# Patient Record
Sex: Male | Born: 1965 | Hispanic: Yes | Marital: Single
Health system: Southern US, Community
[De-identification: ages and names within clinical notes are randomized; demographics above are authoritative.]

## PROBLEM LIST (undated history)

## (undated) DIAGNOSIS — J9621 Acute and chronic respiratory failure with hypoxia: Secondary | ICD-10-CM

## (undated) DIAGNOSIS — J9811 Atelectasis: Secondary | ICD-10-CM

## (undated) DIAGNOSIS — S0993XS Unspecified injury of face, sequela: Secondary | ICD-10-CM

## (undated) DIAGNOSIS — Z930 Tracheostomy status: Secondary | ICD-10-CM

---

## 2019-12-16 ENCOUNTER — Other Ambulatory Visit (HOSPITAL_COMMUNITY): Payer: Self-pay

## 2019-12-16 ENCOUNTER — Inpatient Hospital Stay
Admission: RE | Admit: 2019-12-16 | Discharge: 2020-01-12 | Payer: PRIVATE HEALTH INSURANCE | Source: Other Acute Inpatient Hospital | Attending: Internal Medicine | Admitting: Internal Medicine

## 2019-12-16 DIAGNOSIS — J9690 Respiratory failure, unspecified, unspecified whether with hypoxia or hypercapnia: Secondary | ICD-10-CM

## 2019-12-16 DIAGNOSIS — J9811 Atelectasis: Secondary | ICD-10-CM | POA: Diagnosis present

## 2019-12-16 DIAGNOSIS — T1490XA Injury, unspecified, initial encounter: Secondary | ICD-10-CM

## 2019-12-16 DIAGNOSIS — S0993XS Unspecified injury of face, sequela: Secondary | ICD-10-CM

## 2019-12-16 DIAGNOSIS — R29898 Other symptoms and signs involving the musculoskeletal system: Secondary | ICD-10-CM

## 2019-12-16 DIAGNOSIS — Z931 Gastrostomy status: Secondary | ICD-10-CM

## 2019-12-16 DIAGNOSIS — Z930 Tracheostomy status: Secondary | ICD-10-CM

## 2019-12-16 DIAGNOSIS — J9621 Acute and chronic respiratory failure with hypoxia: Secondary | ICD-10-CM | POA: Diagnosis present

## 2019-12-16 HISTORY — DX: Tracheostomy status: Z93.0

## 2019-12-16 HISTORY — DX: Unspecified injury of face, sequela: S09.93XS

## 2019-12-16 HISTORY — DX: Acute and chronic respiratory failure with hypoxia: J96.21

## 2019-12-16 HISTORY — DX: Atelectasis: J98.11

## 2019-12-17 ENCOUNTER — Other Ambulatory Visit (HOSPITAL_COMMUNITY): Payer: Self-pay

## 2019-12-17 DIAGNOSIS — Z930 Tracheostomy status: Secondary | ICD-10-CM | POA: Diagnosis not present

## 2019-12-17 DIAGNOSIS — S0993XS Unspecified injury of face, sequela: Secondary | ICD-10-CM | POA: Diagnosis not present

## 2019-12-17 DIAGNOSIS — J9621 Acute and chronic respiratory failure with hypoxia: Secondary | ICD-10-CM | POA: Diagnosis not present

## 2019-12-17 DIAGNOSIS — J9811 Atelectasis: Secondary | ICD-10-CM | POA: Diagnosis not present

## 2019-12-17 LAB — BASIC METABOLIC PANEL
Anion gap: 14 (ref 5–15)
BUN: 16 mg/dL (ref 6–20)
CO2: 27 mmol/L (ref 22–32)
Calcium: 9 mg/dL (ref 8.9–10.3)
Chloride: 96 mmol/L — ABNORMAL LOW (ref 98–111)
Creatinine, Ser: 0.62 mg/dL (ref 0.61–1.24)
GFR calc Af Amer: >60 mL/min (ref >60)
GFR calc non Af Amer: >60 mL/min (ref >60)
Glucose, Bld: 136 mg/dL — ABNORMAL HIGH (ref 70–99)
Potassium: 4.5 mmol/L (ref 3.5–5.1)
Sodium: 137 mmol/L (ref 135–145)

## 2019-12-17 LAB — CBC
HCT: 30.9 % — ABNORMAL LOW (ref 39.0–52.0)
Hemoglobin: 9.9 g/dL — ABNORMAL LOW (ref 13.0–17.0)
MCH: 29.9 pg (ref 26.0–34.0)
MCHC: 32 g/dL (ref 30.0–36.0)
MCV: 93.4 fL (ref 80.0–100.0)
Platelets: 453 10*3/uL — ABNORMAL HIGH (ref 150–400)
RBC: 3.31 MIL/uL — ABNORMAL LOW (ref 4.22–5.81)
RDW: 11.7 % (ref 11.5–15.5)
WBC: 6.9 10*3/uL (ref 4.0–10.5)
nRBC: 0 % (ref 0.0–0.2)
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
## 2019-12-18 ENCOUNTER — Encounter: Payer: Self-pay | Admitting: Internal Medicine

## 2019-12-18 DIAGNOSIS — J9811 Atelectasis: Secondary | ICD-10-CM | POA: Diagnosis present

## 2019-12-18 DIAGNOSIS — J9621 Acute and chronic respiratory failure with hypoxia: Secondary | ICD-10-CM | POA: Diagnosis present

## 2019-12-18 DIAGNOSIS — S0993XS Unspecified injury of face, sequela: Secondary | ICD-10-CM | POA: Diagnosis not present

## 2019-12-18 DIAGNOSIS — Z930 Tracheostomy status: Secondary | ICD-10-CM
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
## 2019-12-19 DIAGNOSIS — J9811 Atelectasis: Secondary | ICD-10-CM | POA: Diagnosis not present

## 2019-12-19 DIAGNOSIS — Z930 Tracheostomy status: Secondary | ICD-10-CM | POA: Diagnosis not present

## 2019-12-19 DIAGNOSIS — S0993XS Unspecified injury of face, sequela: Secondary | ICD-10-CM | POA: Diagnosis not present

## 2019-12-19 DIAGNOSIS — J9621 Acute and chronic respiratory failure with hypoxia: Secondary | ICD-10-CM | POA: Diagnosis not present
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
## 2019-12-20 DIAGNOSIS — J9811 Atelectasis: Secondary | ICD-10-CM | POA: Diagnosis not present

## 2019-12-20 DIAGNOSIS — J9621 Acute and chronic respiratory failure with hypoxia: Secondary | ICD-10-CM | POA: Diagnosis not present

## 2019-12-20 DIAGNOSIS — Z930 Tracheostomy status: Secondary | ICD-10-CM | POA: Diagnosis not present

## 2019-12-20 DIAGNOSIS — S0993XS Unspecified injury of face, sequela: Secondary | ICD-10-CM | POA: Diagnosis not present
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
## 2019-12-21 DIAGNOSIS — J9811 Atelectasis: Secondary | ICD-10-CM | POA: Diagnosis not present

## 2019-12-21 DIAGNOSIS — Z930 Tracheostomy status: Secondary | ICD-10-CM | POA: Diagnosis not present

## 2019-12-21 DIAGNOSIS — S0993XS Unspecified injury of face, sequela: Secondary | ICD-10-CM | POA: Diagnosis not present

## 2019-12-21 DIAGNOSIS — J9621 Acute and chronic respiratory failure with hypoxia: Secondary | ICD-10-CM | POA: Diagnosis not present
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
## 2019-12-22 DIAGNOSIS — J9811 Atelectasis: Secondary | ICD-10-CM

## 2019-12-22 DIAGNOSIS — J9621 Acute and chronic respiratory failure with hypoxia: Secondary | ICD-10-CM | POA: Diagnosis not present

## 2019-12-22 DIAGNOSIS — Z930 Tracheostomy status: Secondary | ICD-10-CM

## 2019-12-22 DIAGNOSIS — R29898 Other symptoms and signs involving the musculoskeletal system: Secondary | ICD-10-CM

## 2019-12-22 DIAGNOSIS — S0993XS Unspecified injury of face, sequela: Secondary | ICD-10-CM | POA: Diagnosis not present
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
## 2019-12-23 DIAGNOSIS — J9811 Atelectasis: Secondary | ICD-10-CM | POA: Diagnosis not present

## 2019-12-23 DIAGNOSIS — S0993XS Unspecified injury of face, sequela: Secondary | ICD-10-CM | POA: Diagnosis not present

## 2019-12-23 DIAGNOSIS — J9621 Acute and chronic respiratory failure with hypoxia: Secondary | ICD-10-CM | POA: Diagnosis not present

## 2019-12-23 DIAGNOSIS — R29898 Other symptoms and signs involving the musculoskeletal system: Secondary | ICD-10-CM | POA: Diagnosis not present

## 2019-12-23 NOTE — Progress Notes (Addendum)
Pulmonary Critical Care Medicine [facility] [place]   PULMONARY CRITICAL CARE SERVICE  PROGRESS NOTE  Date of Service: 12/23/2019  Brian Farley  EAV:[id number]  DOB: Dec 27, 1965   DOA: 12/16/2019  Referring Physician: Carron Curie, MD  HPI: Brian Farley is a 54 y.o. male seen for follow up of Acute on Chronic Respiratory Failure.  Patient mains on 28% aerosol trach collar using PMV with no difficulty.    Medications: Reviewed on Rounds  Physical Exam:  Vitals: Pulse 71 respirations 18 BP 105/59 O2 sat 95% temp 98.3  Ventilator Settings 28% aerosol trach collar  . General: Comfortable at this time . Eyes: Grossly normal lids, irises & conjunctiva . ENT: grossly tongue is normal . Neck: no obvious mass . Cardiovascular: S1 S2 normal no gallop . Respiratory: No rales or rhonchi noted . Abdomen: soft . Skin: no rash seen on limited exam . Musculoskeletal: not rigid . Psychiatric:unable to assess . Neurologic: no seizure no involuntary movements         Lab Data:   Basic Metabolic Panel: Recent Labs  Lab 12/17/19 1131  NA 137  K 4.5  CL 96*  CO2 27  GLUCOSE 136*  BUN 16  CREATININE 0.62  CALCIUM 9.0    ABG: No results for input(s): PHART, PCO2ART, PO2ART, HCO3, O2SAT in the last 168 hours.  Liver Function Tests: No results for input(s): AST, ALT, ALKPHOS, BILITOT, PROT, ALBUMIN in the last 168 hours. No results for input(s): LIPASE, AMYLASE in the last 168 hours. No results for input(s): AMMONIA in the last 168 hours.  CBC: Recent Labs  Lab 12/17/19 1131  WBC 6.9  HGB 9.9*  HCT 30.9*  MCV 93.4  PLT 453*    Cardiac Enzymes: No results for input(s): CKTOTAL, CKMB, CKMBINDEX, TROPONINI in the last 168 hours.  BNP (last 3 results) No results for input(s): BNP in the last 8760 hours.  ProBNP (last 3 results) No results for input(s): PROBNP in the last 8760 hours.  Radiological Exams: No results found.  Assessment/Plan Active  Problems:   Acute on chronic respiratory failure with hypoxia (HCC)   Facial trauma, sequela   Tracheostomy status (HCC)   Pulmonary atelectasis   1. Acute on chronic respiratory failure with hypoxia we will continue with T collar trials. Patient is on 28% FiO2using PMV. 2. Multiple facial trauma supportive care  3. Tracheostomy will remain in place 4. Pulmonary atelectasis no change continue with supportive care   I have personally seen and evaluated the patient, evaluated laboratory and imaging results, formulated the assessment and plan and placed orders. The Patient requires high complexity decision making with multiple systems involvement.  Rounds were done with the Respiratory Therapy Director and Staff therapists and discussed with nursing staff also.  Yevonne Pax, MD [facility] Pulmonary Critical Care Medicine Sleep Medicine

## 2019-12-24 ENCOUNTER — Other Ambulatory Visit (HOSPITAL_COMMUNITY): Payer: Self-pay

## 2019-12-24 DIAGNOSIS — S0993XS Unspecified injury of face, sequela: Secondary | ICD-10-CM | POA: Diagnosis not present

## 2019-12-24 DIAGNOSIS — J9621 Acute and chronic respiratory failure with hypoxia: Secondary | ICD-10-CM | POA: Diagnosis not present

## 2019-12-24 DIAGNOSIS — J9811 Atelectasis: Secondary | ICD-10-CM | POA: Diagnosis not present

## 2019-12-24 DIAGNOSIS — R29898 Other symptoms and signs involving the musculoskeletal system: Secondary | ICD-10-CM | POA: Diagnosis not present

## 2019-12-24 LAB — BASIC METABOLIC PANEL
Anion gap: 11 (ref 5–15)
BUN: 9 mg/dL (ref 6–20)
CO2: 29 mmol/L (ref 22–32)
Calcium: 9.1 mg/dL (ref 8.9–10.3)
Chloride: 95 mmol/L — ABNORMAL LOW (ref 98–111)
Creatinine, Ser: 0.65 mg/dL (ref 0.61–1.24)
GFR calc Af Amer: >60 mL/min (ref >60)
GFR calc non Af Amer: >60 mL/min (ref >60)
Glucose, Bld: 106 mg/dL — ABNORMAL HIGH (ref 70–99)
Potassium: 4.4 mmol/L (ref 3.5–5.1)
Sodium: 135 mmol/L (ref 135–145)

## 2019-12-24 LAB — CBC
HCT: 30.6 % — ABNORMAL LOW (ref 39.0–52.0)
Hemoglobin: 9.8 g/dL — ABNORMAL LOW (ref 13.0–17.0)
MCH: 29.8 pg (ref 26.0–34.0)
MCHC: 32 g/dL (ref 30.0–36.0)
MCV: 93 fL (ref 80.0–100.0)
Platelets: 310 10*3/uL (ref 150–400)
RBC: 3.29 MIL/uL — ABNORMAL LOW (ref 4.22–5.81)
RDW: 12.3 % (ref 11.5–15.5)
WBC: 5.9 10*3/uL (ref 4.0–10.5)
nRBC: 0 % (ref 0.0–0.2)

## 2019-12-24 NOTE — Progress Notes (Signed)
Pulmonary Critical Care Medicine [facility] [place]   PULMONARY CRITICAL CARE SERVICE  PROGRESS NOTE  Date of Service: 12/24/2019  Brian Farley  LFY:[id number]  DOB: 10/21/1965   DOA: 12/16/2019  Referring Physician: Carron Curie, MD  HPI: Brian Farley is a 54 y.o. male seen for follow up of Acute on Chronic Respiratory Failure.  Patient currently is on T collar with the PMV on 28% FiO2  Medications: Reviewed on Rounds  Physical Exam:  Vitals: Temperature 97.6 pulse 77 respiratory 20 blood pressure is 119/73 saturations 100%  Ventilator Settings off the ventilator on T collar  . General: Comfortable at this time . Eyes: Grossly normal lids, irises & conjunctiva . ENT: grossly tongue is normal . Neck: no obvious mass . Cardiovascular: S1 S2 normal no gallop . Respiratory: No rhonchi no rales are noted at this time . Abdomen: soft . Skin: no rash seen on limited exam . Musculoskeletal: not rigid . Psychiatric:unable to assess . Neurologic: no seizure no involuntary movements         Lab Data:   Basic Metabolic Panel: Recent Labs  Lab 12/24/19 1110  NA 135  K 4.4  CL 95*  CO2 29  GLUCOSE 106*  BUN 9  CREATININE 0.65  CALCIUM 9.1    ABG: No results for input(s): PHART, PCO2ART, PO2ART, HCO3, O2SAT in the last 168 hours.  Liver Function Tests: No results for input(s): AST, ALT, ALKPHOS, BILITOT, PROT, ALBUMIN in the last 168 hours. No results for input(s): LIPASE, AMYLASE in the last 168 hours. No results for input(s): AMMONIA in the last 168 hours.  CBC: Recent Labs  Lab 12/24/19 1110  WBC 5.9  HGB 9.8*  HCT 30.6*  MCV 93.0  PLT 310    Cardiac Enzymes: No results for input(s): CKTOTAL, CKMB, CKMBINDEX, TROPONINI in the last 168 hours.  BNP (last 3 results) No results for input(s): BNP in the last 8760 hours.  ProBNP (last 3 results) No results for input(s): PROBNP in the last 8760 hours.  Radiological Exams: DG CHEST PORT 1  VIEW  Result Date: 12/24/2019 CLINICAL DATA:  Respiratory failure EXAM: PORTABLE CHEST 1 VIEW COMPARISON:  12/16/2019 FINDINGS: Tracheostomy tube unchanged. Normal cardiac silhouette. No effusion, infiltrate pneumothorax. No acute osseous abnormality. IMPRESSION: No acute cardiopulmonary process. Electronically Signed   By: Genevive Bi M.D.   On: 12/24/2019 08:06    Assessment/Plan Active Problems:   Acute on chronic respiratory failure with hypoxia (HCC)   Facial trauma, sequela   Tracheostomy status (HCC)   Pulmonary atelectasis   1. Acute on chronic respiratory failure hypoxia we will continue with T collar trials titrate oxygen continue pulmonary toilet.  Right now patient is requiring 28% FiO2 with PMV. 2. Facial trauma multiple no changes noted at this time we will continue to monitor 3. Tracheostomy remains in place 4. Pulmonary atelectasis no changes we will continue with supportive care   I have personally seen and evaluated the patient, evaluated laboratory and imaging results, formulated the assessment and plan and placed orders. The Patient requires high complexity decision making with multiple systems involvement.  Rounds were done with the Respiratory Therapy Director and Staff therapists and discussed with nursing staff also.  Yevonne Pax, MD [facility] Pulmonary Critical Care Medicine Sleep Medicine

## 2019-12-25 DIAGNOSIS — S0993XS Unspecified injury of face, sequela: Secondary | ICD-10-CM | POA: Diagnosis not present

## 2019-12-25 DIAGNOSIS — J9811 Atelectasis: Secondary | ICD-10-CM | POA: Diagnosis not present

## 2019-12-25 DIAGNOSIS — J9621 Acute and chronic respiratory failure with hypoxia: Secondary | ICD-10-CM | POA: Diagnosis not present

## 2019-12-25 DIAGNOSIS — R29898 Other symptoms and signs involving the musculoskeletal system: Secondary | ICD-10-CM | POA: Diagnosis not present

## 2019-12-25 NOTE — Progress Notes (Signed)
Pulmonary Critical Care Medicine [facility] [place]   PULMONARY CRITICAL CARE SERVICE  PROGRESS NOTE  Date of Service: 12/25/2019  Brian Farley  [id number]  DOB: July 27, 1966   DOA: 12/16/2019  Referring Physician: Carron Curie, MD  HPI: Brian Farley is a 54 y.o. male seen for follow up of Acute on Chronic Respiratory Failure.  Patient currently is on T collar has been on 28% FiO2 good saturations are noted at this time  Medications: Reviewed on Rounds  Physical Exam:  Vitals: Temperature is 97.5 pulse 72 respiratory 27 blood pressures 121/66 saturations 95%  Ventilator Settings on T collar FiO2 28%  . General: Comfortable at this time . Eyes: Grossly normal lids, irises & conjunctiva . ENT: grossly tongue is normal . Neck: no obvious mass . Cardiovascular: S1 S2 normal no gallop . Respiratory: No rhonchi no rales are noted at this time . Abdomen: soft . Skin: no rash seen on limited exam . Musculoskeletal: not rigid . Psychiatric:unable to assess . Neurologic: no seizure no involuntary movements         Lab Data:   Basic Metabolic Panel: Recent Labs  Lab 12/24/19 1110  NA 135  K 4.4  CL 95*  CO2 29  GLUCOSE 106*  BUN 9  CREATININE 0.65  CALCIUM 9.1    ABG: No results for input(s): PHART, PCO2ART, PO2ART, HCO3, O2SAT in the last 168 hours.  Liver Function Tests: No results for input(s): AST, ALT, ALKPHOS, BILITOT, PROT, ALBUMIN in the last 168 hours. No results for input(s): LIPASE, AMYLASE in the last 168 hours. No results for input(s): AMMONIA in the last 168 hours.  CBC: Recent Labs  Lab 12/24/19 1110  WBC 5.9  HGB 9.8*  HCT 30.6*  MCV 93.0  PLT 310    Cardiac Enzymes: No results for input(s): CKTOTAL, CKMB, CKMBINDEX, TROPONINI in the last 168 hours.  BNP (last 3 results) No results for input(s): BNP in the last 8760 hours.  ProBNP (last 3 results) No results for input(s): PROBNP in the last 8760 hours.  Radiological  Exams: DG CHEST PORT 1 VIEW  Result Date: 12/24/2019 CLINICAL DATA:  Respiratory failure EXAM: PORTABLE CHEST 1 VIEW COMPARISON:  12/16/2019 FINDINGS: Tracheostomy tube unchanged. Normal cardiac silhouette. No effusion, infiltrate pneumothorax. No acute osseous abnormality. IMPRESSION: No acute cardiopulmonary process. Electronically Signed   By: Genevive Bi M.D.   On: 12/24/2019 08:06    Assessment/Plan Active Problems:   Acute on chronic respiratory failure with hypoxia (HCC)   Facial trauma, sequela   Tracheostomy status (HCC)   Pulmonary atelectasis   1. Acute on chronic respiratory failure hypoxia continue with T collar trials continue secretion management supportive care. 2. Facial trauma resolving slow improvement 3. Tracheostomy will remain in place 4. Pulmonary atelectasis clinically stable improving   I have personally seen and evaluated the patient, evaluated laboratory and imaging results, formulated the assessment and plan and placed orders. The Patient requires high complexity decision making with multiple systems involvement.  Rounds were done with the Respiratory Therapy Director and Staff therapists and discussed with nursing staff also.  Yevonne Pax, MD [facility] Pulmonary Critical Care Medicine Sleep Medicine

## 2019-12-26 DIAGNOSIS — J9811 Atelectasis: Secondary | ICD-10-CM | POA: Diagnosis not present

## 2019-12-26 DIAGNOSIS — R29898 Other symptoms and signs involving the musculoskeletal system: Secondary | ICD-10-CM | POA: Diagnosis not present

## 2019-12-26 DIAGNOSIS — J9621 Acute and chronic respiratory failure with hypoxia: Secondary | ICD-10-CM | POA: Diagnosis not present

## 2019-12-26 DIAGNOSIS — S0993XS Unspecified injury of face, sequela: Secondary | ICD-10-CM | POA: Diagnosis not present

## 2019-12-26 NOTE — Progress Notes (Signed)
Pulmonary Critical Care Medicine [facility] [place]   PULMONARY CRITICAL CARE SERVICE  PROGRESS NOTE  Date of Service: 12/26/2019  Brian Farley  [id number]  DOB: June 02, 1966   DOA: 12/16/2019  Referring Physician: Carron Curie, MD  HPI: Brian Farley is a 54 y.o. male seen for follow up of Acute on Chronic Respiratory Failure.  Patient currently is on T collar has been on 28% FiO2 with good saturations  Medications: Reviewed on Rounds  Physical Exam:  Vitals: Temperature is 98.0 pulse 80 respiratory 19 blood pressure is 111/56 saturations 97%  Ventilator Settings on T collar FiO2 28%  . General: Comfortable at this time . Eyes: Grossly normal lids, irises & conjunctiva . ENT: grossly tongue is normal . Neck: no obvious mass . Cardiovascular: S1 S2 normal no gallop . Respiratory: No rhonchi coarse breath sounds are noted at this time . Abdomen: soft . Skin: no rash seen on limited exam . Musculoskeletal: not rigid . Psychiatric:unable to assess . Neurologic: no seizure no involuntary movements         Lab Data:   Basic Metabolic Panel: Recent Labs  Lab 12/24/19 1110  NA 135  K 4.4  CL 95*  CO2 29  GLUCOSE 106*  BUN 9  CREATININE 0.65  CALCIUM 9.1    ABG: No results for input(s): PHART, PCO2ART, PO2ART, HCO3, O2SAT in the last 168 hours.  Liver Function Tests: No results for input(s): AST, ALT, ALKPHOS, BILITOT, PROT, ALBUMIN in the last 168 hours. No results for input(s): LIPASE, AMYLASE in the last 168 hours. No results for input(s): AMMONIA in the last 168 hours.  CBC: Recent Labs  Lab 12/24/19 1110  WBC 5.9  HGB 9.8*  HCT 30.6*  MCV 93.0  PLT 310    Cardiac Enzymes: No results for input(s): CKTOTAL, CKMB, CKMBINDEX, TROPONINI in the last 168 hours.  BNP (last 3 results) No results for input(s): BNP in the last 8760 hours.  ProBNP (last 3 results) No results for input(s): PROBNP in the last 8760 hours.  Radiological  Exams: No results found.  Assessment/Plan Active Problems:   Acute on chronic respiratory failure with hypoxia (HCC)   Facial trauma, sequela   Tracheostomy status (HCC)   Pulmonary atelectasis   1. Acute on chronic respiratory failure hypoxia we will continue to collar trials titrate oxygen continue pulmonary toilet. 2. Facial trauma jaw is wired shut we will continue with supportive care follow-up ENT 3. Tracheostomy will remain in place 4. Pulmonary atelectasis at baseline we will continue supportive care   I have personally seen and evaluated the patient, evaluated laboratory and imaging results, formulated the assessment and plan and placed orders. The Patient requires high complexity decision making with multiple systems involvement.  Rounds were done with the Respiratory Therapy Director and Staff therapists and discussed with nursing staff also.  Yevonne Pax, MD [facility] Pulmonary Critical Care Medicine Sleep Medicine

## 2019-12-27 DIAGNOSIS — J9811 Atelectasis: Secondary | ICD-10-CM | POA: Diagnosis not present

## 2019-12-27 DIAGNOSIS — R29898 Other symptoms and signs involving the musculoskeletal system: Secondary | ICD-10-CM | POA: Diagnosis not present

## 2019-12-27 DIAGNOSIS — S0993XS Unspecified injury of face, sequela: Secondary | ICD-10-CM | POA: Diagnosis not present

## 2019-12-27 DIAGNOSIS — J9621 Acute and chronic respiratory failure with hypoxia: Secondary | ICD-10-CM | POA: Diagnosis not present

## 2019-12-27 NOTE — Progress Notes (Signed)
Pulmonary Critical Care Medicine [facility] [place]   PULMONARY CRITICAL CARE SERVICE  PROGRESS NOTE  Date of Service: 12/27/2019  Brian Farley  [id number]  DOB: 1965-11-27   DOA: 12/16/2019  Referring Physician: Carron Curie, MD  HPI: Brian Farley is a 54 y.o. male seen for follow up of Acute on Chronic Respiratory Failure.  Patient currently is on T collar has been on 28% FiO2 with good saturations.  Medications: Reviewed on Rounds  Physical Exam:  Vitals: Temperature is 99.0 pulse 69 respiratory 16 blood pressure is 96/56 saturations 97%  Ventilator Settings on T collar with an FiO2 of 28%  . General: Comfortable at this time . Eyes: Grossly normal lids, irises & conjunctiva . ENT: grossly tongue is normal . Neck: no obvious mass . Cardiovascular: S1 S2 normal no gallop . Respiratory: No rhonchi coarse breath sounds . Abdomen: soft . Skin: no rash seen on limited exam . Musculoskeletal: not rigid . Psychiatric:unable to assess . Neurologic: no seizure no involuntary movements         Lab Data:   Basic Metabolic Panel: Recent Labs  Lab 12/24/19 1110  NA 135  K 4.4  CL 95*  CO2 29  GLUCOSE 106*  BUN 9  CREATININE 0.65  CALCIUM 9.1    ABG: No results for input(s): PHART, PCO2ART, PO2ART, HCO3, O2SAT in the last 168 hours.  Liver Function Tests: No results for input(s): AST, ALT, ALKPHOS, BILITOT, PROT, ALBUMIN in the last 168 hours. No results for input(s): LIPASE, AMYLASE in the last 168 hours. No results for input(s): AMMONIA in the last 168 hours.  CBC: Recent Labs  Lab 12/24/19 1110  WBC 5.9  HGB 9.8*  HCT 30.6*  MCV 93.0  PLT 310    Cardiac Enzymes: No results for input(s): CKTOTAL, CKMB, CKMBINDEX, TROPONINI in the last 168 hours.  BNP (last 3 results) No results for input(s): BNP in the last 8760 hours.  ProBNP (last 3 results) No results for input(s): PROBNP in the last 8760 hours.  Radiological Exams: No  results found.  Assessment/Plan Active Problems:   Acute on chronic respiratory failure with hypoxia (HCC)   Facial trauma, sequela   Tracheostomy status (HCC)   Pulmonary atelectasis   1. Acute on chronic respiratory failure with hypoxia we will continue with T collar trials titrate oxygen continue pulmonary toilet. 2. Facial trauma no change continue supportive care 3. Tracheostomy remains in place 4. Pulmonary atelectasis doing well continue with pulmonary toileting   I have personally seen and evaluated the patient, evaluated laboratory and imaging results, formulated the assessment and plan and placed orders. The Patient requires high complexity decision making with multiple systems involvement.  Rounds were done with the Respiratory Therapy Director and Staff therapists and discussed with nursing staff also.  Yevonne Pax, MD [facility] Pulmonary Critical Care Medicine Sleep Medicine

## 2019-12-28 DIAGNOSIS — J9621 Acute and chronic respiratory failure with hypoxia: Secondary | ICD-10-CM | POA: Diagnosis not present

## 2019-12-28 DIAGNOSIS — J9811 Atelectasis: Secondary | ICD-10-CM | POA: Diagnosis not present

## 2019-12-28 DIAGNOSIS — S0993XS Unspecified injury of face, sequela: Secondary | ICD-10-CM | POA: Diagnosis not present

## 2019-12-28 DIAGNOSIS — R29898 Other symptoms and signs involving the musculoskeletal system: Secondary | ICD-10-CM | POA: Diagnosis not present

## 2019-12-28 NOTE — Progress Notes (Signed)
Pulmonary Critical Care Medicine [facility] [place]   PULMONARY CRITICAL CARE SERVICE  PROGRESS NOTE  Date of Service: 12/28/2019  Ayrton Mcvay  [id number]:[id number]  DOB: 02/20/1966   DOA: 12/16/2019  Referring Physician: Carron Curie, MD  HPI: Kasyn Rolph is a 54 y.o. male seen for follow up of Acute on Chronic Respiratory Failure.  Patient currently is on T collar with good saturations remains comfortable without distress  Medications: Reviewed on Rounds  Physical Exam:  Vitals: Temperature is 98.4 pulse 77 respiratory 25 blood pressure is 122/62 saturations 99%  Ventilator Settings on T collar FiO2 28%  . General: Comfortable at this time . Eyes: Grossly normal lids, irises & conjunctiva . ENT: grossly tongue is normal . Neck: no obvious mass . Cardiovascular: S1 S2 normal no gallop . Respiratory: No rhonchi no rales are noted . Abdomen: soft . Skin: no rash seen on limited exam . Musculoskeletal: not rigid . Psychiatric:unable to assess . Neurologic: no seizure no involuntary movements         Lab Data:   Basic Metabolic Panel: Recent Labs  Lab 12/24/19 1110  NA 135  K 4.4  CL 95*  CO2 29  GLUCOSE 106*  BUN 9  CREATININE 0.65  CALCIUM 9.1    ABG: No results for input(s): PHART, PCO2ART, PO2ART, HCO3, O2SAT in the last 168 hours.  Liver Function Tests: No results for input(s): AST, ALT, ALKPHOS, BILITOT, PROT, ALBUMIN in the last 168 hours. No results for input(s): LIPASE, AMYLASE in the last 168 hours. No results for input(s): AMMONIA in the last 168 hours.  CBC: Recent Labs  Lab 12/24/19 1110  WBC 5.9  HGB 9.8*  HCT 30.6*  MCV 93.0  PLT 310    Cardiac Enzymes: No results for input(s): CKTOTAL, CKMB, CKMBINDEX, TROPONINI in the last 168 hours.  BNP (last 3 results) No results for input(s): BNP in the last 8760 hours.  ProBNP (last 3 results) No results for input(s): PROBNP in the last 8760 hours.  Radiological Exams: No  results found.  Assessment/Plan Active Problems:   Acute on chronic respiratory failure with hypoxia (HCC)   Facial trauma, sequela   Tracheostomy status (HCC)   Pulmonary atelectasis   1. Acute on chronic respiratory failure with hypoxia currently is on T collar good saturations are noted plan is going to be to continue with the T collar as ordered 2. Facial trauma no change we will continue with supportive care 3. Tracheostomy remains in place 4. Pulmonary atelectasis at baseline we will continue with present therapy and supportive care   I have personally seen and evaluated the patient, evaluated laboratory and imaging results, formulated the assessment and plan and placed orders. The Patient requires high complexity decision making with multiple systems involvement.  Rounds were done with the Respiratory Therapy Director and Staff therapists and discussed with nursing staff also.  Yevonne Pax, MD [facility] Pulmonary Critical Care Medicine Sleep Medicine

## 2019-12-29 DIAGNOSIS — R29898 Other symptoms and signs involving the musculoskeletal system: Secondary | ICD-10-CM | POA: Diagnosis not present

## 2019-12-29 DIAGNOSIS — S0993XS Unspecified injury of face, sequela: Secondary | ICD-10-CM | POA: Diagnosis not present

## 2019-12-29 DIAGNOSIS — J9811 Atelectasis: Secondary | ICD-10-CM | POA: Diagnosis not present

## 2019-12-29 DIAGNOSIS — J9621 Acute and chronic respiratory failure with hypoxia: Secondary | ICD-10-CM | POA: Diagnosis not present

## 2019-12-29 NOTE — Progress Notes (Signed)
Pulmonary Critical Care Medicine [facility] [place]   PULMONARY CRITICAL CARE SERVICE  PROGRESS NOTE  Date of Service: 12/29/2019  Brian Farley  [id number]  DOB: [birth year]   DOA: 12/16/2019  Referring Physician: Carron Curie, MD  HPI: Brian Farley is a 54 y.o. male seen for follow up of Acute on Chronic Respiratory Failure.  Patient is comfortable right now without distress has been on T collar on 28% FiO2.  Patient has good saturations noted  Medications: Reviewed on Rounds  Physical Exam:  Vitals: Temperature is 98.6 pulse 72 respiratory 19 blood pressure is 140/53 saturations 99%  Ventilator Settings off ventilator on T collar with an FiO2 28%  . General: Comfortable at this time . Eyes: Grossly normal lids, irises & conjunctiva . ENT: grossly tongue is normal . Neck: no obvious mass . Cardiovascular: S1 S2 normal no gallop . Respiratory: No rhonchi coarse breath sounds are noted . Abdomen: soft . Skin: no rash seen on limited exam . Musculoskeletal: not rigid . Psychiatric:unable to assess . Neurologic: no seizure no involuntary movements         Lab Data:   Basic Metabolic Panel: Recent Labs  Lab 12/24/19 1110  NA 135  K 4.4  CL 95*  CO2 29  GLUCOSE 106*  BUN 9  CREATININE 0.65  CALCIUM 9.1    ABG: No results for input(s): PHART, PCO2ART, PO2ART, HCO3, O2SAT in the last 168 hours.  Liver Function Tests: No results for input(s): AST, ALT, ALKPHOS, BILITOT, PROT, ALBUMIN in the last 168 hours. No results for input(s): LIPASE, AMYLASE in the last 168 hours. No results for input(s): AMMONIA in the last 168 hours.  CBC: Recent Labs  Lab 12/24/19 1110  WBC 5.9  HGB 9.8*  HCT 30.6*  MCV 93.0  PLT 310    Cardiac Enzymes: No results for input(s): CKTOTAL, CKMB, CKMBINDEX, TROPONINI in the last 168 hours.  BNP (last 3 results) No results for input(s): BNP in the last 8760 hours.  ProBNP (last 3 results) No results for  input(s): PROBNP in the last 8760 hours.  Radiological Exams: No results found.  Assessment/Plan Active Problems:   Acute on chronic respiratory failure with hypoxia (HCC)   Facial trauma, sequela   Tracheostomy status (HCC)   Pulmonary atelectasis   1. Acute on chronic respiratory failure hypoxia patient currently is on T collar has been on 28% FiO2 good saturations are noted. 2. Patient trauma no change supportive care patient's chart work showed 3. Tracheostomy remains in place 4. Pulmonary atelectasis prevention continue with aggressive pulmonary toilet   I have personally seen and evaluated the patient, evaluated laboratory and imaging results, formulated the assessment and plan and placed orders. The Patient requires high complexity decision making with multiple systems involvement.  Rounds were done with the Respiratory Therapy Director and Staff therapists and discussed with nursing staff also.  Yevonne Pax, MD [facility] Pulmonary Critical Care Medicine Sleep Medicine

## 2019-12-30 DIAGNOSIS — R29898 Other symptoms and signs involving the musculoskeletal system: Secondary | ICD-10-CM | POA: Diagnosis not present

## 2019-12-30 DIAGNOSIS — S0993XS Unspecified injury of face, sequela: Secondary | ICD-10-CM | POA: Diagnosis not present

## 2019-12-30 DIAGNOSIS — J9621 Acute and chronic respiratory failure with hypoxia: Secondary | ICD-10-CM | POA: Diagnosis not present

## 2019-12-30 DIAGNOSIS — J9811 Atelectasis: Secondary | ICD-10-CM | POA: Diagnosis not present

## 2019-12-30 LAB — BASIC METABOLIC PANEL
Anion gap: 9 (ref 5–15)
BUN: <5 mg/dL — ABNORMAL LOW (ref 6–20)
CO2: 29 mmol/L (ref 22–32)
Calcium: 9.2 mg/dL (ref 8.9–10.3)
Chloride: 99 mmol/L (ref 98–111)
Creatinine, Ser: 0.72 mg/dL (ref 0.61–1.24)
GFR calc Af Amer: >60 mL/min (ref >60)
GFR calc non Af Amer: >60 mL/min (ref >60)
Glucose, Bld: 107 mg/dL — ABNORMAL HIGH (ref 70–99)
Potassium: 4.6 mmol/L (ref 3.5–5.1)
Sodium: 137 mmol/L (ref 135–145)

## 2019-12-30 LAB — CBC
HCT: 31.1 % — ABNORMAL LOW (ref 39.0–52.0)
Hemoglobin: 9.9 g/dL — ABNORMAL LOW (ref 13.0–17.0)
MCH: 29.3 pg (ref 26.0–34.0)
MCHC: 31.8 g/dL (ref 30.0–36.0)
MCV: 92 fL (ref 80.0–100.0)
Platelets: 234 10*3/uL (ref 150–400)
RBC: 3.38 MIL/uL — ABNORMAL LOW (ref 4.22–5.81)
RDW: 12.3 % (ref 11.5–15.5)
WBC: 7.2 10*3/uL (ref 4.0–10.5)
nRBC: 0 % (ref 0.0–0.2)

## 2019-12-30 NOTE — Progress Notes (Addendum)
Pulmonary Critical Care Medicine [facility] [place]   PULMONARY CRITICAL CARE SERVICE  PROGRESS NOTE  Date of Service: 12/30/2019  Brian Farley  [id number]  DOB: 03-24-1966   DOA: 12/16/2019  Referring Physician: Carron Curie, MD  HPI: Brian Farley is a 54 y.o. male seen for follow up of Acute on Chronic Respiratory Failure.  Patient continues on aerosol trach collar 28% FiO2 using PMV with no difficulty.  Medications: Reviewed on Rounds  Physical Exam:  Vitals: 71 respirations 16 BP 97/50 O2 sat 97% temp 98.9  Ventilator Settings ATC 28%  . General: Comfortable at this time . Eyes: Grossly normal lids, irises & conjunctiva . ENT: grossly tongue is normal . Neck: no obvious mass . Cardiovascular: S1 S2 normal no gallop . Respiratory: No rales or rhonchi noted . Abdomen: soft . Skin: no rash seen on limited exam . Musculoskeletal: not rigid . Psychiatric:unable to assess . Neurologic: no seizure no involuntary movements         Lab Data:   Basic Metabolic Panel: Recent Labs  Lab 12/24/19 1110 12/30/19 0722  NA 135 137  K 4.4 4.6  CL 95* 99  CO2 29 29  GLUCOSE 106* 107*  BUN 9 <5*  CREATININE 0.65 0.72  CALCIUM 9.1 9.2    ABG: No results for input(s): PHART, PCO2ART, PO2ART, HCO3, O2SAT in the last 168 hours.  Liver Function Tests: No results for input(s): AST, ALT, ALKPHOS, BILITOT, PROT, ALBUMIN in the last 168 hours. No results for input(s): LIPASE, AMYLASE in the last 168 hours. No results for input(s): AMMONIA in the last 168 hours.  CBC: Recent Labs  Lab 12/24/19 1110 12/30/19 0722  WBC 5.9 7.2  HGB 9.8* 9.9*  HCT 30.6* 31.1*  MCV 93.0 92.0  PLT 310 234    Cardiac Enzymes: No results for input(s): CKTOTAL, CKMB, CKMBINDEX, TROPONINI in the last 168 hours.  BNP (last 3 results) No results for input(s): BNP in the last 8760 hours.  ProBNP (last 3 results) No results for input(s): PROBNP in the last 8760  hours.  Radiological Exams: No results found.  Assessment/Plan Active Problems:   Acute on chronic respiratory failure with hypoxia (HCC)   Facial trauma, sequela   Tracheostomy status (HCC)   Pulmonary atelectasis   1. Acute on chronic respiratory failure hypoxia patient continue on 20% aerosol trach collar at this time using PMV.  We will continue supportive measures and continue to attempt weaning as tolerated. 2. Patient trauma no change supportive care patient's chart work showed 3. Tracheostomy remains in place 4. Pulmonary atelectasis prevention continue with aggressive pulmonary toilet   I have personally seen and evaluated the patient, evaluated laboratory and imaging results, formulated the assessment and plan and placed orders. The Patient requires high complexity decision making with multiple systems involvement.  Rounds were done with the Respiratory Therapy Director and Staff therapists and discussed with nursing staff also.  Yevonne Pax, MD [facility] Pulmonary Critical Care Medicine Sleep Medicine

## 2019-12-31 DIAGNOSIS — J9811 Atelectasis: Secondary | ICD-10-CM | POA: Diagnosis not present

## 2019-12-31 DIAGNOSIS — J9621 Acute and chronic respiratory failure with hypoxia: Secondary | ICD-10-CM | POA: Diagnosis not present

## 2019-12-31 DIAGNOSIS — R29898 Other symptoms and signs involving the musculoskeletal system: Secondary | ICD-10-CM | POA: Diagnosis not present

## 2019-12-31 DIAGNOSIS — S0993XS Unspecified injury of face, sequela: Secondary | ICD-10-CM | POA: Diagnosis not present

## 2019-12-31 NOTE — Progress Notes (Addendum)
Pulmonary Critical Care Medicine [facility] [place]   PULMONARY CRITICAL CARE SERVICE  PROGRESS NOTE  Date of Service: 12/31/2019  Brian Farley  VZC:[id number]  DOB: [birth year]   DOA: 12/16/2019  Referring Physician: Carron Curie, MD  HPI: Brian Farley is a 54 y.o. male seen for follow up of Acute on Chronic Respiratory Failure.  Patient continues on 1 L nasal cannula at this time has been capped by respiratory per protocol and is doing well.  Encourage respiratory to closely monitor for secretions as patient's mandible is wired shut.  Medications: Reviewed on Rounds  Physical Exam:  Vitals: Pulse 70 respirations 20 BP 92/49 O2 sat 100% temp 98.0  Ventilator Settings 1 L nasal cannula  . General: Comfortable at this time . Eyes: Grossly normal lids, irises & conjunctiva . ENT: grossly tongue is normal . Neck: no obvious mass . Cardiovascular: S1 S2 normal no gallop . Respiratory: No rales or rhonchi noted . Abdomen: soft . Skin: no rash seen on limited exam . Musculoskeletal: not rigid . Psychiatric:unable to assess . Neurologic: no seizure no involuntary movements         Lab Data:   Basic Metabolic Panel: Recent Labs  Lab 12/30/19 0722  NA 137  K 4.6  CL 99  CO2 29  GLUCOSE 107*  BUN <5*  CREATININE 0.72  CALCIUM 9.2    ABG: No results for input(s): PHART, PCO2ART, PO2ART, HCO3, O2SAT in the last 168 hours.  Liver Function Tests: No results for input(s): AST, ALT, ALKPHOS, BILITOT, PROT, ALBUMIN in the last 168 hours. No results for input(s): LIPASE, AMYLASE in the last 168 hours. No results for input(s): AMMONIA in the last 168 hours.  CBC: Recent Labs  Lab 12/30/19 0722  WBC 7.2  HGB 9.9*  HCT 31.1*  MCV 92.0  PLT 234    Cardiac Enzymes: No results for input(s): CKTOTAL, CKMB, CKMBINDEX, TROPONINI in the last 168 hours.  BNP (last 3 results) No results for input(s): BNP in the last 8760 hours.  ProBNP (last 3 results) No  results for input(s): PROBNP in the last 8760 hours.  Radiological Exams: No results found.  Assessment/Plan Active Problems:   Acute on chronic respiratory failure with hypoxia (HCC)   Facial trauma, sequela   Tracheostomy status (HCC)   Pulmonary atelectasis   1. Acute on chronic respiratory failure hypoxia  patient remains at this time 1 L nasal cannula continue supportive measures and pulmonary toilet continue close secretion management. 2. Patient trauma no change supportive care patient's chart work showed 3. Tracheostomy remains in place 4. Pulmonary atelectasis prevention continue with aggressive pulmonary toilet   I have personally seen and evaluated the patient, evaluated laboratory and imaging results, formulated the assessment and plan and placed orders. The Patient requires high complexity decision making with multiple systems involvement.  Rounds were done with the Respiratory Therapy Director and Staff therapists and discussed with nursing staff also.  Yevonne Pax, MD [facility] Pulmonary Critical Care Medicine Sleep Medicine

## 2020-01-01 DIAGNOSIS — S0993XS Unspecified injury of face, sequela: Secondary | ICD-10-CM | POA: Diagnosis not present

## 2020-01-01 DIAGNOSIS — J9621 Acute and chronic respiratory failure with hypoxia: Secondary | ICD-10-CM | POA: Diagnosis not present

## 2020-01-01 DIAGNOSIS — J9811 Atelectasis: Secondary | ICD-10-CM | POA: Diagnosis not present

## 2020-01-01 DIAGNOSIS — R29898 Other symptoms and signs involving the musculoskeletal system: Secondary | ICD-10-CM | POA: Diagnosis not present

## 2020-01-02 DIAGNOSIS — J9811 Atelectasis: Secondary | ICD-10-CM | POA: Diagnosis not present

## 2020-01-02 DIAGNOSIS — J9621 Acute and chronic respiratory failure with hypoxia: Secondary | ICD-10-CM | POA: Diagnosis not present

## 2020-01-02 DIAGNOSIS — S0993XS Unspecified injury of face, sequela: Secondary | ICD-10-CM | POA: Diagnosis not present

## 2020-01-02 DIAGNOSIS — R29898 Other symptoms and signs involving the musculoskeletal system: Secondary | ICD-10-CM | POA: Diagnosis not present

## 2020-01-02 NOTE — Progress Notes (Addendum)
Pulmonary Critical Care Medicine [facility] [place]   PULMONARY CRITICAL CARE SERVICE  PROGRESS NOTE  Date of Service: 01/01/2020  Brian Farley  [id number]  DOB: 21-May-1966   DOA: 12/16/2019  Referring Physician: Carron Curie, MD  HPI: Brian Farley is a 54 y.o. male seen for follow up of Acute on Chronic Respiratory Failure.  Patient remains capped now for 24 hours on 1 L nasal cannula satting well no distress.  Medications: Reviewed on Rounds  Physical Exam:  Vitals: Pulse 70 respirations 16 BP 96/50 O2 sat 98% temp 98.5  Ventilator Settings 1 L nasal cannula  . General: Comfortable at this time . Eyes: Grossly normal lids, irises & conjunctiva . ENT: grossly tongue is normal . Neck: no obvious mass . Cardiovascular: S1 S2 normal no gallop . Respiratory: No rales or rhonchi noted . Abdomen: soft . Skin: no rash seen on limited exam . Musculoskeletal: not rigid . Psychiatric:unable to assess . Neurologic: no seizure no involuntary movements         Lab Data:   Basic Metabolic Panel: Recent Labs  Lab 12/30/19 0722  NA 137  K 4.6  CL 99  CO2 29  GLUCOSE 107*  BUN <5*  CREATININE 0.72  CALCIUM 9.2    ABG: No results for input(s): PHART, PCO2ART, PO2ART, HCO3, O2SAT in the last 168 hours.  Liver Function Tests: No results for input(s): AST, ALT, ALKPHOS, BILITOT, PROT, ALBUMIN in the last 168 hours. No results for input(s): LIPASE, AMYLASE in the last 168 hours. No results for input(s): AMMONIA in the last 168 hours.  CBC: Recent Labs  Lab 12/30/19 0722  WBC 7.2  HGB 9.9*  HCT 31.1*  MCV 92.0  PLT 234    Cardiac Enzymes: No results for input(s): CKTOTAL, CKMB, CKMBINDEX, TROPONINI in the last 168 hours.  BNP (last 3 results) No results for input(s): BNP in the last 8760 hours.  ProBNP (last 3 results) No results for input(s): PROBNP in the last 8760 hours.  Radiological Exams: No results found.  Assessment/Plan Active  Problems:   Acute on chronic respiratory failure with hypoxia (HCC)   Facial trauma, sequela   Tracheostomy status (HCC)   Pulmonary atelectasis   1. Acute on chronic respiratory failure hypoxia  patient will continue 1 L nasal cannula at this time.  Capping will continue respiratory is instructed to monitor for secretions closely due to mandible wired shut.  Continue supportive measures and pulmonary toilet 2. Patient trauma no change supportive care patient's chart work showed 3. Tracheostomy remains in place 4. Pulmonary atelectasis prevention continue with aggressive pulmonary toilet   I have personally seen and evaluated the patient, evaluated laboratory and imaging results, formulated the assessment and plan and placed orders. The Patient requires high complexity decision making with multiple systems involvement.  Rounds were done with the Respiratory Therapy Director and Staff therapists and discussed with nursing staff also.  Yevonne Pax, MD [facility] Pulmonary Critical Care Medicine Sleep Medicine

## 2020-01-02 NOTE — Progress Notes (Signed)
Pulmonary Critical Care Medicine [facility] [place]   PULMONARY CRITICAL CARE SERVICE  PROGRESS NOTE  Date of Service: 01/02/2020  Brian Farley  [id number]  DOB: Jan 13, 1966   DOA: 12/16/2019  Referring Physician: Carron Curie, MD  HPI: Brian Farley is a 54 y.o. male seen for follow up of Acute on Chronic Respiratory Failure.  This morning apparently started on capping trials patient is on 1 L of oxygen.  Reportedly has no secretions and is not requiring suctioning  Medications: Reviewed on Rounds  Physical Exam:  Vitals: Temperature 98.6 pulse 74 respiratory 18 blood pressure is 117/68 saturations 99%  Ventilator Settings right now is O 1 L  . General: Comfortable at this time . Eyes: Grossly normal lids, irises & conjunctiva . ENT: grossly tongue is normal . Neck: no obvious mass . Cardiovascular: S1 S2 normal no gallop . Respiratory: No rhonchi no rales are noted at this time . Abdomen: soft . Skin: no rash seen on limited exam . Musculoskeletal: not rigid . Psychiatric:unable to assess . Neurologic: no seizure no involuntary movements         Lab Data:   Basic Metabolic Panel: Recent Labs  Lab 12/30/19 0722  NA 137  K 4.6  CL 99  CO2 29  GLUCOSE 107*  BUN <5*  CREATININE 0.72  CALCIUM 9.2    ABG: No results for input(s): PHART, PCO2ART, PO2ART, HCO3, O2SAT in the last 168 hours.  Liver Function Tests: No results for input(s): AST, ALT, ALKPHOS, BILITOT, PROT, ALBUMIN in the last 168 hours. No results for input(s): LIPASE, AMYLASE in the last 168 hours. No results for input(s): AMMONIA in the last 168 hours.  CBC: Recent Labs  Lab 12/30/19 0722  WBC 7.2  HGB 9.9*  HCT 31.1*  MCV 92.0  PLT 234    Cardiac Enzymes: No results for input(s): CKTOTAL, CKMB, CKMBINDEX, TROPONINI in the last 168 hours.  BNP (last 3 results) No results for input(s): BNP in the last 8760 hours.  ProBNP (last 3 results) No results for input(s):  PROBNP in the last 8760 hours.  Radiological Exams: No results found.  Assessment/Plan Active Problems:   Acute on chronic respiratory failure with hypoxia (HCC)   Facial trauma, sequela   Tracheostomy status (HCC)   Pulmonary atelectasis   1. Acute on chronic respiratory failure hypoxia patient has been started on capping secretions on absent patient is handling them well.  The patient will need to be closely monitored while he is capping. 2. Pulmonary atelectasis clinically improving continue with supportive care 3. Facial trauma will need follow-up after discharge 4. Tracheostomy currently is capping but I still would not plan on removing the trach   I have personally seen and evaluated the patient, evaluated laboratory and imaging results, formulated the assessment and plan and placed orders. The Patient requires high complexity decision making with multiple systems involvement.  Rounds were done with the Respiratory Therapy Director and Staff therapists and discussed with nursing staff also.  Yevonne Pax, MD [facility] Pulmonary Critical Care Medicine Sleep Medicine

## 2020-01-03 DIAGNOSIS — R29898 Other symptoms and signs involving the musculoskeletal system: Secondary | ICD-10-CM | POA: Diagnosis not present

## 2020-01-03 DIAGNOSIS — J9621 Acute and chronic respiratory failure with hypoxia: Secondary | ICD-10-CM | POA: Diagnosis not present

## 2020-01-03 DIAGNOSIS — S0993XS Unspecified injury of face, sequela: Secondary | ICD-10-CM | POA: Diagnosis not present

## 2020-01-03 DIAGNOSIS — J9811 Atelectasis: Secondary | ICD-10-CM | POA: Diagnosis not present

## 2020-01-03 NOTE — Progress Notes (Signed)
Pulmonary Critical Care Medicine [facility] [place]   PULMONARY CRITICAL CARE SERVICE  PROGRESS NOTE  Date of Service: 01/03/2020  Dyllan Hughett  [id number]:[id number]  DOB: [birth year]   DOA: 12/16/2019  Referring Physician: Carron Curie, MD  HPI: Leon Montoya is a 54 y.o. male seen for follow up of Acute on Chronic Respiratory Failure.  Patient is doing well with capping trials.  Spoke with the case manager he is supposed to go for a appointment with the ENT for evaluation of residual.  I have instructed respiratory therapy not to decannulate the patient because until we have the jaw wires removed he needs the tracheostomy for his airway protection.  Medications: Reviewed on Rounds  Physical Exam:  Vitals: Temperature is 98.4 pulse 75 respiratory 18 blood pressure is 105/60 saturations 97%  Ventilator Settings on capping trials 1 L of O2  . General: Comfortable at this time . Eyes: Grossly normal lids, irises & conjunctiva . ENT: grossly tongue is normal . Neck: no obvious mass . Cardiovascular: S1 S2 normal no gallop . Respiratory: No rhonchi no rales are noted at this time . Abdomen: soft . Skin: no rash seen on limited exam . Musculoskeletal: not rigid . Psychiatric:unable to assess . Neurologic: no seizure no involuntary movements         Lab Data:   Basic Metabolic Panel: Recent Labs  Lab 12/30/19 0722  NA 137  K 4.6  CL 99  CO2 29  GLUCOSE 107*  BUN <5*  CREATININE 0.72  CALCIUM 9.2    ABG: No results for input(s): PHART, PCO2ART, PO2ART, HCO3, O2SAT in the last 168 hours.  Liver Function Tests: No results for input(s): AST, ALT, ALKPHOS, BILITOT, PROT, ALBUMIN in the last 168 hours. No results for input(s): LIPASE, AMYLASE in the last 168 hours. No results for input(s): AMMONIA in the last 168 hours.  CBC: Recent Labs  Lab 12/30/19 0722  WBC 7.2  HGB 9.9*  HCT 31.1*  MCV 92.0  PLT 234    Cardiac Enzymes: No results for input(s):  CKTOTAL, CKMB, CKMBINDEX, TROPONINI in the last 168 hours.  BNP (last 3 results) No results for input(s): BNP in the last 8760 hours.  ProBNP (last 3 results) No results for input(s): PROBNP in the last 8760 hours.  Radiological Exams: No results found.  Assessment/Plan Active Problems:   Acute on chronic respiratory failure with hypoxia (HCC)   Facial trauma, sequela   Tracheostomy status (HCC)   Pulmonary atelectasis   1. Acute on chronic respiratory failure hypoxia doing well with capping trial he is ready actually to proceed to the next phase however first we need to have the ENT evaluate his jaw 2. Facial trauma due for an evaluation by ENT as appointment is for tomorrow Case management is working on getting the patient transferred to [facility] for assessment 3. Tracheostomy will remain in place no plan for decannulation until cleared by ENT 4. Pulmonary atelectasis no change continue secretion management supportive care   I have personally seen and evaluated the patient, evaluated laboratory and imaging results, formulated the assessment and plan and placed orders. The Patient requires high complexity decision making with multiple systems involvement.  Rounds were done with the Respiratory Therapy Director and Staff therapists and discussed with nursing staff also.  Yevonne Pax, MD [facility] Pulmonary Critical Care Medicine Sleep Medicine

## 2020-01-04 DIAGNOSIS — R29898 Other symptoms and signs involving the musculoskeletal system: Secondary | ICD-10-CM | POA: Diagnosis not present

## 2020-01-04 DIAGNOSIS — S0993XS Unspecified injury of face, sequela: Secondary | ICD-10-CM | POA: Diagnosis not present

## 2020-01-04 DIAGNOSIS — J9811 Atelectasis: Secondary | ICD-10-CM | POA: Diagnosis not present

## 2020-01-04 DIAGNOSIS — J9621 Acute and chronic respiratory failure with hypoxia: Secondary | ICD-10-CM | POA: Diagnosis not present

## 2020-01-04 NOTE — Progress Notes (Addendum)
Pulmonary Critical Care Medicine [facility] [place]   PULMONARY CRITICAL CARE SERVICE  PROGRESS NOTE  Date of Service: 01/04/2020  Brian Farley  [id number]  DOB: 1965/11/07   DOA: 12/16/2019  Referring Physician: Carron Curie, MD  HPI: Brian Farley is a 54 y.o. male seen for follow up of Acute on Chronic Respiratory Failure.  Patient remains capped on room air at this time still no plans for decannulation.  Satting well no distress.  Medications: Reviewed on Rounds  Physical Exam:  Vitals: Pulse 71 respirations 15 BP 100/62 O2 sat 98% temp 97.4  Ventilator Settings room air  . General: Comfortable at this time . Eyes: Grossly normal lids, irises & conjunctiva . ENT: grossly tongue is normal . Neck: no obvious mass . Cardiovascular: S1 S2 normal no gallop . Respiratory: No rales or rhonchi noted . Abdomen: soft . Skin: no rash seen on limited exam . Musculoskeletal: not rigid . Psychiatric:unable to assess . Neurologic: no seizure no involuntary movements         Lab Data:   Basic Metabolic Panel: Recent Labs  Lab 12/30/19 0722  NA 137  K 4.6  CL 99  CO2 29  GLUCOSE 107*  BUN <5*  CREATININE 0.72  CALCIUM 9.2    ABG: No results for input(s): PHART, PCO2ART, PO2ART, HCO3, O2SAT in the last 168 hours.  Liver Function Tests: No results for input(s): AST, ALT, ALKPHOS, BILITOT, PROT, ALBUMIN in the last 168 hours. No results for input(s): LIPASE, AMYLASE in the last 168 hours. No results for input(s): AMMONIA in the last 168 hours.  CBC: Recent Labs  Lab 12/30/19 0722  WBC 7.2  HGB 9.9*  HCT 31.1*  MCV 92.0  PLT 234    Cardiac Enzymes: No results for input(s): CKTOTAL, CKMB, CKMBINDEX, TROPONINI in the last 168 hours.  BNP (last 3 results) No results for input(s): BNP in the last 8760 hours.  ProBNP (last 3 results) No results for input(s): PROBNP in the last 8760 hours.  Radiological Exams: No results  found.  Assessment/Plan Active Problems:   Acute on chronic respiratory failure with hypoxia (HCC)   Facial trauma, sequela   Tracheostomy status (HCC)   Pulmonary atelectasis   1. Acute on chronic respiratory failure hypoxia patient is doing well now for 4 days capped on room air satting well no distress.  Still awaiting ENT eval before decannulation.  Continue supportive measures 2. Facial trauma due for an evaluation by ENT as appointment is for tomorrow Case management is working on getting the patient transferred to [facility] for assessment 3. Tracheostomy will remain in place no plan for decannulation until cleared by ENT 4. Pulmonary atelectasis no change continue secretion management supportive care   I have personally seen and evaluated the patient, evaluated laboratory and imaging results, formulated the assessment and plan and placed orders. The Patient requires high complexity decision making with multiple systems involvement.  Rounds were done with the Respiratory Therapy Director and Staff therapists and discussed with nursing staff also.  Yevonne Pax, MD [facility] Pulmonary Critical Care Medicine Sleep Medicine

## 2020-01-05 ENCOUNTER — Other Ambulatory Visit (HOSPITAL_COMMUNITY): Payer: Self-pay

## 2020-01-05 DIAGNOSIS — J9811 Atelectasis: Secondary | ICD-10-CM | POA: Diagnosis not present

## 2020-01-05 DIAGNOSIS — R29898 Other symptoms and signs involving the musculoskeletal system: Secondary | ICD-10-CM | POA: Diagnosis not present

## 2020-01-05 DIAGNOSIS — S0993XS Unspecified injury of face, sequela: Secondary | ICD-10-CM | POA: Diagnosis not present

## 2020-01-05 DIAGNOSIS — J9621 Acute and chronic respiratory failure with hypoxia: Secondary | ICD-10-CM | POA: Diagnosis not present

## 2020-01-05 NOTE — Progress Notes (Signed)
Pulmonary Critical Care Medicine [place]   PULMONARY CRITICAL CARE SERVICE  PROGRESS NOTE  Date of Service: 01/05/2020  Brian Farley  [id number]  DOB: 26-Oct-1965   DOA: 12/16/2019  Referring Physician: Merton Border, MD  HPI: Brian Farley is a 54 y.o. male seen for follow up of Acute on Chronic Respiratory Failure.  Patient is doing well with capping currently had an appointment with ENT and he will continue to have the transplant plan is going to be to keep the tracheostomy in place  Medications: Reviewed on Rounds  Physical Exam:  Vitals: Temperature 97.9 is 98 respiratory 22 blood pressure is 105/55 saturations 99%  Ventilator Settings on capping trials with [place]  . General: Comfortable at this time . Eyes: Grossly normal lids, irises & conjunctiva . ENT: grossly tongue is normal . Neck: no obvious mass . Cardiovascular: S1 S2 normal no gallop . Respiratory: No rhonchi no rales are noted at this time . Abdomen: soft . Skin: no rash seen on limited exam . Musculoskeletal: not rigid . Psychiatric:unable to assess . Neurologic: no seizure no involuntary movements         Lab Data:   Basic Metabolic Panel: Recent Labs  Lab 12/30/19 0722  NA 137  K 4.6  CL 99  CO2 29  GLUCOSE 107*  BUN <5*  CREATININE 0.72  CALCIUM 9.2    ABG: No results for input(s): PHART, PCO2ART, PO2ART, HCO3, O2SAT in the last 168 hours.  Liver Function Tests: No results for input(s): AST, ALT, ALKPHOS, BILITOT, PROT, ALBUMIN in the last 168 hours. No results for input(s): LIPASE, AMYLASE in the last 168 hours. No results for input(s): AMMONIA in the last 168 hours.  CBC: Recent Labs  Lab 12/30/19 0722  WBC 7.2  HGB 9.9*  HCT 31.1*  MCV 92.0  PLT 234    Cardiac Enzymes: No results for input(s): CKTOTAL, CKMB, CKMBINDEX, TROPONINI in the last 168 hours.  BNP (last 3 results) No results for input(s): BNP in the last 8760 hours.  ProBNP (last 3  results) No results for input(s): PROBNP in the last 8760 hours.  Radiological Exams: CT ORBITS WO CONTRAST  Result Date: 01/05/2020 CLINICAL DATA:  Left orbital damage.  Acute inflammation left orbit. EXAM: CT ORBITS WITHOUT CONTRAST TECHNIQUE: Multidetector CT images were obtained using the standard protocol without intravenous contrast. COMPARISON:  None. FINDINGS: Orbits: Extensive orbital fractures bilaterally as described below. There is soft tissue swelling of the left lower eyelid which is asymmetric. No orbital mass or edema. Globe is normal bilaterally. Lens is normally position. Extraocular muscles and optic nerve normal in caliber bilaterally. Skeletal: Extensive facial fractures. Unfortunately, no prior studies available for comparison. Comminuted fractures of the orbital floor bilaterally. Fracture of the orbital roof and lateral orbit bilaterally. Fracture of the medial orbit bilaterally with significant displacement. Displaced fractures of the maxillary sinus bilaterally plate fixation of frontozygomatic fracture bilaterally. Fractures of the zygomatic arch bilaterally. Fracture through the frontal sinus. Prior craniotomy and repair of the internal table fracture with a metal plate. There is dural thickening in the frontal lobes bilaterally anteriorly. Comminuted displaced fracture of the nasal bone. Large bone fragment in the soft tissues anterior and inferior to the nasal bone. Extensive and displaced fractures of the maxillary sinus bilaterally Visualized sinuses: Mucosal edema in the maxillary sinus bilaterally. Near complete opacification of the frontal sinus. Soft tissues: Soft tissue swelling left lower eyelid. Limited intracranial: Bifrontal craniotomy. Dural thickening anteriorly with mild extra-axial  fluid collection. No hemorrhage. Ventricle size normal. IMPRESSION: 1. Soft tissue swelling left lower eyelid of uncertain significance. Differential includes cellulitis, scarring, and  contusion. Correlate with timing of prior injury. 2. Extensive facial fractures as described above. Electronically Signed   By: Marlan Palau M.D.   On: 01/05/2020 13:20    Assessment/Plan Active Problems:   Acute on chronic respiratory failure with hypoxia (HCC)   Facial trauma, sequela   Tracheostomy status (HCC)   Pulmonary atelectasis   1. Acute on chronic respiratory failure hypoxia plan is to continue with on capping trials as tolerated for patient comfort 2. Facial trauma status post reconstructive surgery has been followed up with ENT 3. Tracheostomy remains in place and will remain in place until his generalized on wired 4. Pulmonary atelectasis needs ongoing aggressive pulmonary toilet   I have personally seen and evaluated the patient, evaluated laboratory and imaging results, formulated the assessment and plan and placed orders. The Patient requires high complexity decision making with multiple systems involvement.  Rounds were done with the Respiratory Therapy Director and Staff therapists and discussed with nursing staff also.  Yevonne Pax, MD [facility] Pulmonary Critical Care Medicine Sleep Medicine

## 2020-01-06 DIAGNOSIS — S0993XS Unspecified injury of face, sequela: Secondary | ICD-10-CM | POA: Diagnosis not present

## 2020-01-06 DIAGNOSIS — J9621 Acute and chronic respiratory failure with hypoxia: Secondary | ICD-10-CM | POA: Diagnosis not present

## 2020-01-06 DIAGNOSIS — J9811 Atelectasis: Secondary | ICD-10-CM | POA: Diagnosis not present

## 2020-01-06 DIAGNOSIS — R29898 Other symptoms and signs involving the musculoskeletal system: Secondary | ICD-10-CM | POA: Diagnosis not present

## 2020-01-06 NOTE — Progress Notes (Addendum)
Pulmonary Critical Care Medicine [facility] [place]   PULMONARY CRITICAL CARE SERVICE  PROGRESS NOTE  Date of Service: 01/06/2020  Brian Farley  UYQ:[id number]  DOB: 08-28-1965   DOA: 12/16/2019  Referring Physician: Carron Curie, MD  HPI: Brian Farley is a 54 y.o. male seen for follow up of Acute on Chronic Respiratory Failure.  Patient remains capped on room air at this time satting well no distress.  Medications: Reviewed on Rounds  Physical Exam:  Vitals: Pulse 65 respirations 17 BP 118/60 O2 sat 98% temp 98 point  Ventilator Settings Room air  . General: Comfortable at this time . Eyes: Grossly normal lids, irises & conjunctiva . ENT: grossly tongue is normal . Neck: no obvious mass . Cardiovascular: S1 S2 normal no gallop . Respiratory: No rales or rhonchi noted . Abdomen: soft . Skin: no rash seen on limited exam . Musculoskeletal: not rigid . Psychiatric:unable to assess . Neurologic: no seizure no involuntary movements         Lab Data:   Basic Metabolic Panel: No results for input(s): NA, K, CL, CO2, GLUCOSE, BUN, CREATININE, CALCIUM, MG, PHOS in the last 168 hours.  ABG: No results for input(s): PHART, PCO2ART, PO2ART, HCO3, O2SAT in the last 168 hours.  Liver Function Tests: No results for input(s): AST, ALT, ALKPHOS, BILITOT, PROT, ALBUMIN in the last 168 hours. No results for input(s): LIPASE, AMYLASE in the last 168 hours. No results for input(s): AMMONIA in the last 168 hours.  CBC: No results for input(s): WBC, NEUTROABS, HGB, HCT, MCV, PLT in the last 168 hours.  Cardiac Enzymes: No results for input(s): CKTOTAL, CKMB, CKMBINDEX, TROPONINI in the last 168 hours.  BNP (last 3 results) No results for input(s): BNP in the last 8760 hours.  ProBNP (last 3 results) No results for input(s): PROBNP in the last 8760 hours.  Radiological Exams: CT ORBITS WO CONTRAST  Result Date: 01/05/2020 CLINICAL DATA:  Left orbital damage.   Acute inflammation left orbit. EXAM: CT ORBITS WITHOUT CONTRAST TECHNIQUE: Multidetector CT images were obtained using the standard protocol without intravenous contrast. COMPARISON:  None. FINDINGS: Orbits: Extensive orbital fractures bilaterally as described below. There is soft tissue swelling of the left lower eyelid which is asymmetric. No orbital mass or edema. Globe is normal bilaterally. Lens is normally position. Extraocular muscles and optic nerve normal in caliber bilaterally. Skeletal: Extensive facial fractures. Unfortunately, no prior studies available for comparison. Comminuted fractures of the orbital floor bilaterally. Fracture of the orbital roof and lateral orbit bilaterally. Fracture of the medial orbit bilaterally with significant displacement. Displaced fractures of the maxillary sinus bilaterally plate fixation of frontozygomatic fracture bilaterally. Fractures of the zygomatic arch bilaterally. Fracture through the frontal sinus. Prior craniotomy and repair of the internal table fracture with a metal plate. There is dural thickening in the frontal lobes bilaterally anteriorly. Comminuted displaced fracture of the nasal bone. Large bone fragment in the soft tissues anterior and inferior to the nasal bone. Extensive and displaced fractures of the maxillary sinus bilaterally Visualized sinuses: Mucosal edema in the maxillary sinus bilaterally. Near complete opacification of the frontal sinus. Soft tissues: Soft tissue swelling left lower eyelid. Limited intracranial: Bifrontal craniotomy. Dural thickening anteriorly with mild extra-axial fluid collection. No hemorrhage. Ventricle size normal. IMPRESSION: 1. Soft tissue swelling left lower eyelid of uncertain significance. Differential includes cellulitis, scarring, and contusion. Correlate with timing of prior injury. 2. Extensive facial fractures as described above. Electronically Signed   By: Marlan Palau M.D.  On: 01/05/2020 13:20     Assessment/Plan Active Problems:   Acute on chronic respiratory failure with hypoxia (HCC)   Facial trauma, sequela   Tracheostomy status (HCC)   Pulmonary atelectasis   1. Acute on chronic respiratory failure hypoxia plan is to continue with on capping trials as tolerated for patient comfort 2. Facial trauma status post reconstructive surgery has been followed up with ENT 3. Tracheostomy remains in place and will remain in place until his generalized on wired 4. Pulmonary atelectasis needs ongoing aggressive pulmonary toilet   I have personally seen and evaluated the patient, evaluated laboratory and imaging results, formulated the assessment and plan and placed orders. The Patient requires high complexity decision making with multiple systems involvement.  Rounds were done with the Respiratory Therapy Director and Staff therapists and discussed with nursing staff also.  Allyne Gee, MD [facility] Pulmonary Critical Care Medicine Sleep Medicine

## 2020-01-07 DIAGNOSIS — S0993XS Unspecified injury of face, sequela: Secondary | ICD-10-CM | POA: Diagnosis not present

## 2020-01-07 DIAGNOSIS — J9621 Acute and chronic respiratory failure with hypoxia: Secondary | ICD-10-CM | POA: Diagnosis not present

## 2020-01-07 DIAGNOSIS — R29898 Other symptoms and signs involving the musculoskeletal system: Secondary | ICD-10-CM | POA: Diagnosis not present

## 2020-01-07 DIAGNOSIS — J9811 Atelectasis: Secondary | ICD-10-CM | POA: Diagnosis not present

## 2020-01-07 LAB — CBC
HCT: 31.5 % — ABNORMAL LOW (ref 39.0–52.0)
Hemoglobin: 10.2 g/dL — ABNORMAL LOW (ref 13.0–17.0)
MCH: 28.9 pg (ref 26.0–34.0)
MCHC: 32.4 g/dL (ref 30.0–36.0)
MCV: 89.2 fL (ref 80.0–100.0)
Platelets: 231 10*3/uL (ref 150–400)
RBC: 3.53 MIL/uL — ABNORMAL LOW (ref 4.22–5.81)
RDW: 12.3 % (ref 11.5–15.5)
WBC: 6.9 10*3/uL (ref 4.0–10.5)
nRBC: 0 % (ref 0.0–0.2)

## 2020-01-07 LAB — BASIC METABOLIC PANEL
Anion gap: 10 (ref 5–15)
BUN: 12 mg/dL (ref 6–20)
CO2: 29 mmol/L (ref 22–32)
Calcium: 9.2 mg/dL (ref 8.9–10.3)
Chloride: 101 mmol/L (ref 98–111)
Creatinine, Ser: 0.61 mg/dL (ref 0.61–1.24)
GFR calc Af Amer: >60 mL/min (ref >60)
GFR calc non Af Amer: >60 mL/min (ref >60)
Glucose, Bld: 109 mg/dL — ABNORMAL HIGH (ref 70–99)
Potassium: 3.9 mmol/L (ref 3.5–5.1)
Sodium: 140 mmol/L (ref 135–145)

## 2020-01-07 NOTE — Progress Notes (Signed)
Pulmonary Critical Care Medicine [facility] [place]   PULMONARY CRITICAL CARE SERVICE  PROGRESS NOTE  Date of Service: 01/07/2020  Brian Farley  PPI:[id number]  DOB: [birth year]   DOA: 12/16/2019  Referring Physician: Carron Curie, MD  HPI: Brian Farley is a 54 y.o. male seen for follow up of Acute on Chronic Respiratory Failure. Patient currently is capping on room air without any distress at this time  Medications: Reviewed on Rounds  Physical Exam:  Vitals: Temperature is 97.7 pulse 104 respiratory rate 20 blood pressure is 141/80 saturations 97%  Ventilator Settings on T collar with FiO2 of 28%  . General: Comfortable at this time . Eyes: Grossly normal lids, irises & conjunctiva . ENT: grossly tongue is normal . Neck: no obvious mass . Cardiovascular: S1 S2 normal no gallop . Respiratory: No rhonchi no rales are noted at this time . Abdomen: soft . Skin: no rash seen on limited exam . Musculoskeletal: not rigid . Psychiatric:unable to assess . Neurologic: no seizure no involuntary movements         Lab Data:   Basic Metabolic Panel: Recent Labs  Lab 01/07/20 0658  NA 140  K 3.9  CL 101  CO2 29  GLUCOSE 109*  BUN 12  CREATININE 0.61  CALCIUM 9.2    ABG: No results for input(s): PHART, PCO2ART, PO2ART, HCO3, O2SAT in the last 168 hours.  Liver Function Tests: No results for input(s): AST, ALT, ALKPHOS, BILITOT, PROT, ALBUMIN in the last 168 hours. No results for input(s): LIPASE, AMYLASE in the last 168 hours. No results for input(s): AMMONIA in the last 168 hours.  CBC: Recent Labs  Lab 01/07/20 0658  WBC 6.9  HGB 10.2*  HCT 31.5*  MCV 89.2  PLT 231    Cardiac Enzymes: No results for input(s): CKTOTAL, CKMB, CKMBINDEX, TROPONINI in the last 168 hours.  BNP (last 3 results) No results for input(s): BNP in the last 8760 hours.  ProBNP (last 3 results) No results for input(s): PROBNP in the last 8760 hours.  Radiological  Exams: No results found.  Assessment/Plan Active Problems:   Acute on chronic respiratory failure with hypoxia (HCC)   Facial trauma, sequela   Tracheostomy status (HCC)   Pulmonary atelectasis   1. Acute on chronic respiratory failure with hypoxia we will continue with T collar currently on 28% FiO2 continue secretion management supportive care. 2. Facial trauma no change supportive care 3. Tracheostomy remains in place 4. Pulmonary atelectasis at baseline continue with pulmonary toilet   I have personally seen and evaluated the patient, evaluated laboratory and imaging results, formulated the assessment and plan and placed orders. The Patient requires high complexity decision making with multiple systems involvement.  Rounds were done with the Respiratory Therapy Director and Staff therapists and discussed with nursing staff also.  Yevonne Pax, MD [facility] Pulmonary Critical Care Medicine Sleep Medicine

## 2020-01-08 DIAGNOSIS — R29898 Other symptoms and signs involving the musculoskeletal system: Secondary | ICD-10-CM | POA: Diagnosis not present

## 2020-01-08 DIAGNOSIS — J9621 Acute and chronic respiratory failure with hypoxia: Secondary | ICD-10-CM | POA: Diagnosis not present

## 2020-01-08 DIAGNOSIS — J9811 Atelectasis: Secondary | ICD-10-CM | POA: Diagnosis not present

## 2020-01-08 DIAGNOSIS — S0993XS Unspecified injury of face, sequela: Secondary | ICD-10-CM | POA: Diagnosis not present

## 2020-01-08 NOTE — Progress Notes (Signed)
Pulmonary Critical Care Medicine [facility] [place]   PULMONARY CRITICAL CARE SERVICE  PROGRESS NOTE  Date of Service: 01/08/2020  Brian Farley  [id number]:[id number]  DOB: [birth year]   DOA: 12/16/2019  Referring Physician: Carron Curie, MD  HPI: Brian Farley is a 54 y.o. male seen for follow up of Acute on Chronic Respiratory Failure.  Patient is comfortable right now without distress is doing well with the capping requiring no suctioning reportedly  Medications: Reviewed on Rounds  Physical Exam:  Vitals: Temperature 97.3 pulse 75 respiratory 18 blood pressure is 144/64 saturations 96%  Ventilator Settings capping off the ventilator  . General: Comfortable at this time . Eyes: Grossly normal lids, irises & conjunctiva . ENT: grossly tongue is normal . Neck: no obvious mass . Cardiovascular: S1 S2 normal no gallop . Respiratory: No rhonchi no rales are noted at this time . Abdomen: soft . Skin: no rash seen on limited exam . Musculoskeletal: not rigid . Psychiatric:unable to assess . Neurologic: no seizure no involuntary movements         Lab Data:   Basic Metabolic Panel: Recent Labs  Lab 01/07/20 0658  NA 140  K 3.9  CL 101  CO2 29  GLUCOSE 109*  BUN 12  CREATININE 0.61  CALCIUM 9.2    ABG: No results for input(s): PHART, PCO2ART, PO2ART, HCO3, O2SAT in the last 168 hours.  Liver Function Tests: No results for input(s): AST, ALT, ALKPHOS, BILITOT, PROT, ALBUMIN in the last 168 hours. No results for input(s): LIPASE, AMYLASE in the last 168 hours. No results for input(s): AMMONIA in the last 168 hours.  CBC: Recent Labs  Lab 01/07/20 0658  WBC 6.9  HGB 10.2*  HCT 31.5*  MCV 89.2  PLT 231    Cardiac Enzymes: No results for input(s): CKTOTAL, CKMB, CKMBINDEX, TROPONINI in the last 168 hours.  BNP (last 3 results) No results for input(s): BNP in the last 8760 hours.  ProBNP (last 3 results) No results for input(s): PROBNP in the last  8760 hours.  Radiological Exams: No results found.  Assessment/Plan Active Problems:   Acute on chronic respiratory failure with hypoxia (HCC)   Facial trauma, sequela   Tracheostomy status (HCC)   Pulmonary atelectasis   1. Acute on chronic respiratory failure with hypoxia doing fine with capping patient will be remaining with tracheostomy in place 2. Facial trauma wired jaw follow-up with ENT 3. Tracheostomy will remain in place doing fine with capping 4. Pulmonary atelectasis no change continue with supportive care   I have personally seen and evaluated the patient, evaluated laboratory and imaging results, formulated the assessment and plan and placed orders. The Patient requires high complexity decision making with multiple systems involvement.  Rounds were done with the Respiratory Therapy Director and Staff therapists and discussed with nursing staff also.  Yevonne Pax, MD [facility] Pulmonary Critical Care Medicine Sleep Medicine

## 2020-01-09 DIAGNOSIS — J9811 Atelectasis: Secondary | ICD-10-CM | POA: Diagnosis not present

## 2020-01-09 DIAGNOSIS — J9621 Acute and chronic respiratory failure with hypoxia: Secondary | ICD-10-CM | POA: Diagnosis not present

## 2020-01-09 DIAGNOSIS — R29898 Other symptoms and signs involving the musculoskeletal system: Secondary | ICD-10-CM | POA: Diagnosis not present

## 2020-01-09 DIAGNOSIS — S0993XS Unspecified injury of face, sequela: Secondary | ICD-10-CM | POA: Diagnosis not present

## 2020-01-09 NOTE — Progress Notes (Signed)
Pulmonary Critical Care Medicine [facility] [place]   PULMONARY CRITICAL CARE SERVICE  PROGRESS NOTE  Date of Service: 01/09/2020  Marcellus Pulliam  OZY:[id number]  DOB: 12/17/1965   DOA: 12/16/2019  Referring Physician: Carron Curie, MD  HPI: Delshawn Stech is a 54 y.o. male seen for follow up of Acute on Chronic Respiratory Failure.  Patient is capping doing fairly well on room air right now  Medications: Reviewed on Rounds  Physical Exam:  Vitals: Temperature 97.6 pulse 68 respiratory 20 blood pressure is 105/48 saturations 98%  Ventilator Settings capping off the ventilator  . General: Comfortable at this time . Eyes: Grossly normal lids, irises & conjunctiva . ENT: grossly tongue is normal . Neck: no obvious mass . Cardiovascular: S1 S2 normal no gallop . Respiratory: No rhonchi coarse breath sounds . Abdomen: soft . Skin: no rash seen on limited exam . Musculoskeletal: not rigid . Psychiatric:unable to assess . Neurologic: no seizure no involuntary movements         Lab Data:   Basic Metabolic Panel: Recent Labs  Lab 01/07/20 0658  NA 140  K 3.9  CL 101  CO2 29  GLUCOSE 109*  BUN 12  CREATININE 0.61  CALCIUM 9.2    ABG: No results for input(s): PHART, PCO2ART, PO2ART, HCO3, O2SAT in the last 168 hours.  Liver Function Tests: No results for input(s): AST, ALT, ALKPHOS, BILITOT, PROT, ALBUMIN in the last 168 hours. No results for input(s): LIPASE, AMYLASE in the last 168 hours. No results for input(s): AMMONIA in the last 168 hours.  CBC: Recent Labs  Lab 01/07/20 0658  WBC 6.9  HGB 10.2*  HCT 31.5*  MCV 89.2  PLT 231    Cardiac Enzymes: No results for input(s): CKTOTAL, CKMB, CKMBINDEX, TROPONINI in the last 168 hours.  BNP (last 3 results) No results for input(s): BNP in the last 8760 hours.  ProBNP (last 3 results) No results for input(s): PROBNP in the last 8760 hours.  Radiological Exams: No results  found.  Assessment/Plan Active Problems:   Acute on chronic respiratory failure with hypoxia (HCC)   Facial trauma, sequela   Tracheostomy status (HCC)   Pulmonary atelectasis   1. Acute on chronic respiratory failure with hypoxia plan is to continue with capping no decannulation until ENT is cleared 2. Facial trauma status post ENT evaluation will need follow-up after discharge 3. Tracheostomy remains in place 4. Pulmonary atelectasis no change supportive care continue toileting   I have personally seen and evaluated the patient, evaluated laboratory and imaging results, formulated the assessment and plan and placed orders. The Patient requires high complexity decision making with multiple systems involvement.  Rounds were done with the Respiratory Therapy Director and Staff therapists and discussed with nursing staff also.  Yevonne Pax, MD [facility] Pulmonary Critical Care Medicine Sleep Medicine

## 2020-01-10 DIAGNOSIS — J9621 Acute and chronic respiratory failure with hypoxia: Secondary | ICD-10-CM | POA: Diagnosis not present

## 2020-01-10 DIAGNOSIS — J9811 Atelectasis: Secondary | ICD-10-CM | POA: Diagnosis not present

## 2020-01-10 DIAGNOSIS — R29898 Other symptoms and signs involving the musculoskeletal system: Secondary | ICD-10-CM | POA: Diagnosis not present

## 2020-01-10 DIAGNOSIS — S0993XS Unspecified injury of face, sequela: Secondary | ICD-10-CM | POA: Diagnosis not present

## 2020-01-10 NOTE — Progress Notes (Signed)
Pulmonary Critical Care Medicine [facility] [place]   PULMONARY CRITICAL CARE SERVICE  PROGRESS NOTE  Date of Service: 01/10/2020  Brian Farley  [id number]:[id number]  DOB: 09/21/1965   DOA: 12/16/2019  Referring Physician: Carron Curie, MD  HPI: Brian Farley is a 54 y.o. male seen for follow up of Acute on Chronic Respiratory Failure.  Patient is doing well with capping remains on room air secretions are unremarkable  Medications: Reviewed on Rounds  Physical Exam:  Vitals: Temperature 96.8 pulse 70 respiratory rate 16 blood pressure is 120/64 saturations 97%  Ventilator Settings capping on room air  . General: Comfortable at this time . Eyes: Grossly normal lids, irises & conjunctiva . ENT: grossly tongue is normal . Neck: no obvious mass . Cardiovascular: S1 S2 normal no gallop . Respiratory: No rhonchi no rales are noted at this time . Abdomen: soft . Skin: no rash seen on limited exam . Musculoskeletal: not rigid . Psychiatric:unable to assess . Neurologic: no seizure no involuntary movements         Lab Data:   Basic Metabolic Panel: Recent Labs  Lab 01/07/20 0658  NA 140  K 3.9  CL 101  CO2 29  GLUCOSE 109*  BUN 12  CREATININE 0.61  CALCIUM 9.2    ABG: No results for input(s): PHART, PCO2ART, PO2ART, HCO3, O2SAT in the last 168 hours.  Liver Function Tests: No results for input(s): AST, ALT, ALKPHOS, BILITOT, PROT, ALBUMIN in the last 168 hours. No results for input(s): LIPASE, AMYLASE in the last 168 hours. No results for input(s): AMMONIA in the last 168 hours.  CBC: Recent Labs  Lab 01/07/20 0658  WBC 6.9  HGB 10.2*  HCT 31.5*  MCV 89.2  PLT 231    Cardiac Enzymes: No results for input(s): CKTOTAL, CKMB, CKMBINDEX, TROPONINI in the last 168 hours.  BNP (last 3 results) No results for input(s): BNP in the last 8760 hours.  ProBNP (last 3 results) No results for input(s): PROBNP in the last 8760 hours.  Radiological  Exams: No results found.  Assessment/Plan Active Problems:   Acute on chronic respiratory failure with hypoxia (HCC)   Facial trauma, sequela   Tracheostomy status (HCC)   Pulmonary atelectasis   1. Acute on chronic respiratory failure hypoxia we will continue with capping as tolerated continue secretion management supportive care. 2. Facial trauma no change supportive care follow-up ENT 3. Tracheostomy remains in place 4. Pulmonary atelectasis no change supportive care continue with pulmonary toileting   I have personally seen and evaluated the patient, evaluated laboratory and imaging results, formulated the assessment and plan and placed orders. The Patient requires high complexity decision making with multiple systems involvement.  Rounds were done with the Respiratory Therapy Director and Staff therapists and discussed with nursing staff also.  Yevonne Pax, MD [facility] Pulmonary Critical Care Medicine Sleep Medicine

## 2020-01-11 DIAGNOSIS — S0993XS Unspecified injury of face, sequela: Secondary | ICD-10-CM | POA: Diagnosis not present

## 2020-01-11 DIAGNOSIS — J9621 Acute and chronic respiratory failure with hypoxia: Secondary | ICD-10-CM | POA: Diagnosis not present

## 2020-01-11 DIAGNOSIS — R29898 Other symptoms and signs involving the musculoskeletal system: Secondary | ICD-10-CM | POA: Diagnosis not present

## 2020-01-11 DIAGNOSIS — J9811 Atelectasis: Secondary | ICD-10-CM | POA: Diagnosis not present

## 2020-01-11 NOTE — Progress Notes (Signed)
Pulmonary Critical Care Medicine [facility] [place]   PULMONARY CRITICAL CARE SERVICE  PROGRESS NOTE  Date of Service: 01/11/2020  Brian Farley  HMC:[id number]  DOB: Dec 17, 1965   DOA: 12/16/2019  Referring Physician: Carron Curie, MD  HPI: Brian Farley is a 54 y.o. male seen for follow up of Acute on Chronic Respiratory Failure.  Patient currently is capping doing fine on room air  Medications: Reviewed on Rounds  Physical Exam:  Vitals: Temperature 97.9 pulse 72 respiratory 18 blood pressure is 100/62 saturations 100%  Ventilator Settings capping off the ventilator  . General: Comfortable at this time . Eyes: Grossly normal lids, irises & conjunctiva . ENT: grossly tongue is normal . Neck: no obvious mass . Cardiovascular: S1 S2 normal no gallop . Respiratory: No rhonchi coarse breath sounds . Abdomen: soft . Skin: no rash seen on limited exam . Musculoskeletal: not rigid . Psychiatric:unable to assess . Neurologic: no seizure no involuntary movements         Lab Data:   Basic Metabolic Panel: Recent Labs  Lab 01/07/20 0658  NA 140  K 3.9  CL 101  CO2 29  GLUCOSE 109*  BUN 12  CREATININE 0.61  CALCIUM 9.2    ABG: No results for input(s): PHART, PCO2ART, PO2ART, HCO3, O2SAT in the last 168 hours.  Liver Function Tests: No results for input(s): AST, ALT, ALKPHOS, BILITOT, PROT, ALBUMIN in the last 168 hours. No results for input(s): LIPASE, AMYLASE in the last 168 hours. No results for input(s): AMMONIA in the last 168 hours.  CBC: Recent Labs  Lab 01/07/20 0658  WBC 6.9  HGB 10.2*  HCT 31.5*  MCV 89.2  PLT 231    Cardiac Enzymes: No results for input(s): CKTOTAL, CKMB, CKMBINDEX, TROPONINI in the last 168 hours.  BNP (last 3 results) No results for input(s): BNP in the last 8760 hours.  ProBNP (last 3 results) No results for input(s): PROBNP in the last 8760 hours.  Radiological Exams: No results  found.  Assessment/Plan Active Problems:   Acute on chronic respiratory failure with hypoxia (HCC)   Facial trauma, sequela   Tracheostomy status (HCC)   Pulmonary atelectasis   1. Acute on chronic respiratory failure hypoxia plan continue with capping trials not requiring any oxygen 2. Facial trauma supportive care follow-up ENT 3. Tracheostomy remains in place 4. Pulmonary atelectasis continue pulmonary toilet   I have personally seen and evaluated the patient, evaluated laboratory and imaging results, formulated the assessment and plan and placed orders. The Patient requires high complexity decision making with multiple systems involvement.  Rounds were done with the Respiratory Therapy Director and Staff therapists and discussed with nursing staff also.  Yevonne Pax, MD [facility] Pulmonary Critical Care Medicine Sleep Medicine

## 2020-01-12 DIAGNOSIS — R29898 Other symptoms and signs involving the musculoskeletal system: Secondary | ICD-10-CM | POA: Diagnosis not present

## 2020-01-12 DIAGNOSIS — J9621 Acute and chronic respiratory failure with hypoxia: Secondary | ICD-10-CM | POA: Diagnosis not present

## 2020-01-12 DIAGNOSIS — S0993XS Unspecified injury of face, sequela: Secondary | ICD-10-CM | POA: Diagnosis not present

## 2020-01-12 DIAGNOSIS — J9811 Atelectasis: Secondary | ICD-10-CM | POA: Diagnosis not present

## 2020-01-12 NOTE — Progress Notes (Signed)
Pulmonary Critical Care Medicine [facility] [place]   PULMONARY CRITICAL CARE SERVICE  PROGRESS NOTE  Date of Service: 01/12/2020  Brian Farley  WER:[id number]  DOB: 1965-09-29   DOA: 12/16/2019  Referring Physician: Carron Curie, MD  HPI: Brian Farley is a 54 y.o. male seen for follow up of Acute on Chronic Respiratory Failure.  Patient is capping without distress at this time secretions are minimal  Medications: Reviewed on Rounds  Physical Exam:  Vitals: Temperature 98.7 pulse 84 respiratory rate 20 blood pressure is 110/72 saturations 98%  Ventilator Settings capping off the ventilator  . General: Comfortable at this time . Eyes: Grossly normal lids, irises & conjunctiva . ENT: grossly tongue is normal . Neck: no obvious mass . Cardiovascular: S1 S2 normal no gallop . Respiratory: No rhonchi rales noted at this time . Abdomen: soft . Skin: no rash seen on limited exam . Musculoskeletal: not rigid . Psychiatric:unable to assess . Neurologic: no seizure no involuntary movements         Lab Data:   Basic Metabolic Panel: Recent Labs  Lab 01/07/20 0658  NA 140  K 3.9  CL 101  CO2 29  GLUCOSE 109*  BUN 12  CREATININE 0.61  CALCIUM 9.2    ABG: No results for input(s): PHART, PCO2ART, PO2ART, HCO3, O2SAT in the last 168 hours.  Liver Function Tests: No results for input(s): AST, ALT, ALKPHOS, BILITOT, PROT, ALBUMIN in the last 168 hours. No results for input(s): LIPASE, AMYLASE in the last 168 hours. No results for input(s): AMMONIA in the last 168 hours.  CBC: Recent Labs  Lab 01/07/20 0658  WBC 6.9  HGB 10.2*  HCT 31.5*  MCV 89.2  PLT 231    Cardiac Enzymes: No results for input(s): CKTOTAL, CKMB, CKMBINDEX, TROPONINI in the last 168 hours.  BNP (last 3 results) No results for input(s): BNP in the last 8760 hours.  ProBNP (last 3 results) No results for input(s): PROBNP in the last 8760 hours.  Radiological Exams: No results  found.  Assessment/Plan Active Problems:   Acute on chronic respiratory failure with hypoxia (HCC)   Facial trauma, sequela   Tracheostomy status (HCC)   Pulmonary atelectasis   1. Acute on chronic respiratory failure hypoxia we will continue with capping trials titrate oxygen continue pulmonary toilet. 2. Facial trauma jaw is wired shut supportive care 3. Tracheostomy will continue to follow 4. Pulmonary atelectasis no change in is good strong cough   I have personally seen and evaluated the patient, evaluated laboratory and imaging results, formulated the assessment and plan and placed orders. The Patient requires high complexity decision making with multiple systems involvement.  Rounds were done with the Respiratory Therapy Director and Staff therapists and discussed with nursing staff also.  Yevonne Pax, MD [facility] Pulmonary Critical Care Medicine Sleep Medicine

## 2020-01-13 ENCOUNTER — Inpatient Hospital Stay
Admission: RE | Admit: 2020-01-13 | Discharge: 2020-02-07 | Disposition: A | Discharge status: Home / Self Care | Payer: PRIVATE HEALTH INSURANCE | Source: Other Acute Inpatient Hospital | Attending: Internal Medicine | Admitting: Internal Medicine

## 2020-01-13 ENCOUNTER — Other Ambulatory Visit (HOSPITAL_COMMUNITY): Payer: Self-pay

## 2020-01-13 DIAGNOSIS — Z930 Tracheostomy status: Secondary | ICD-10-CM

## 2020-01-13 DIAGNOSIS — Z431 Encounter for attention to gastrostomy: Secondary | ICD-10-CM

## 2020-01-13 DIAGNOSIS — S0993XS Unspecified injury of face, sequela: Secondary | ICD-10-CM

## 2020-01-13 DIAGNOSIS — J9621 Acute and chronic respiratory failure with hypoxia: Secondary | ICD-10-CM | POA: Diagnosis present

## 2020-01-13 DIAGNOSIS — J9811 Atelectasis: Secondary | ICD-10-CM | POA: Diagnosis present

## 2020-01-13 MED ORDER — CIPROFLOXACIN HCL 500 MG PO TABS
500.00 | ORAL_TABLET | ORAL | Status: DC
Start: 2020-01-14 — End: 2020-01-13

## 2020-01-14 DIAGNOSIS — S0993XS Unspecified injury of face, sequela: Secondary | ICD-10-CM

## 2020-01-14 DIAGNOSIS — Z930 Tracheostomy status: Secondary | ICD-10-CM | POA: Diagnosis not present

## 2020-01-14 DIAGNOSIS — J9811 Atelectasis: Secondary | ICD-10-CM

## 2020-01-14 DIAGNOSIS — J9621 Acute and chronic respiratory failure with hypoxia: Secondary | ICD-10-CM | POA: Diagnosis not present

## 2020-01-14 NOTE — Progress Notes (Signed)
Pulmonary Critical Care Medicine [facility] [place]   PULMONARY CRITICAL CARE SERVICE  PROGRESS NOTE  Date of Service: 01/14/2020  Brian Farley  [id number]:[id number]  DOB: 1966-04-29   DOA: 01/13/2020  Referring Physician: Carron Curie, MD  HPI: Brian Farley is a 54 y.o. male seen for follow up of Acute on Chronic Respiratory Failure.  Patient is comfortable right now without distress remains on capping trials has been on room air without any issues  Medications: Reviewed on Rounds  Physical Exam:  Vitals: Temperature 97.7 pulse 81 respiratory 24 blood pressure is 92/50 saturations 94%  Ventilator Settings on capping trials room air  . General: Comfortable at this time . Eyes: Grossly normal lids, irises & conjunctiva . ENT: grossly tongue is normal . Neck: no obvious mass . Cardiovascular: S1 S2 normal no gallop . Respiratory: No rhonchi no rales noted at this time . Abdomen: soft . Skin: no rash seen on limited exam . Musculoskeletal: not rigid . Psychiatric:unable to assess . Neurologic: no seizure no involuntary movements         Lab Data:   Basic Metabolic Panel: No results for input(s): NA, K, CL, CO2, GLUCOSE, BUN, CREATININE, CALCIUM, MG, PHOS in the last 168 hours.  ABG: No results for input(s): PHART, PCO2ART, PO2ART, HCO3, O2SAT in the last 168 hours.  Liver Function Tests: No results for input(s): AST, ALT, ALKPHOS, BILITOT, PROT, ALBUMIN in the last 168 hours. No results for input(s): LIPASE, AMYLASE in the last 168 hours. No results for input(s): AMMONIA in the last 168 hours.  CBC: No results for input(s): WBC, NEUTROABS, HGB, HCT, MCV, PLT in the last 168 hours.  Cardiac Enzymes: No results for input(s): CKTOTAL, CKMB, CKMBINDEX, TROPONINI in the last 168 hours.  BNP (last 3 results) No results for input(s): BNP in the last 8760 hours.  ProBNP (last 3 results) No results for input(s): PROBNP in the last 8760 hours.  Radiological  Exams: DG ABDOMEN PEG TUBE LOCATION  Result Date: 01/13/2020 CLINICAL DATA:  PEG placement. EXAM: ABDOMEN - 1 VIEW COMPARISON:  12/16/2019 FINDINGS: Gastrostomy tube tip in the mid stomach with injected contrast in the tubing and in the gastric fundus. No extravasated contrast seen. Normal bowel gas pattern. Mild lumbar and lower thoracic spine degenerative changes. IMPRESSION: Gastrostomy tube tip in the mid stomach. Electronically Signed   By: Beckie Salts M.D.   On: 01/13/2020 16:24    Assessment/Plan Active Problems:   Acute on chronic respiratory failure with hypoxia (HCC)   Facial trauma, sequela   Tracheostomy status (HCC)   Pulmonary atelectasis   1. Acute on chronic respiratory failure hypoxia plan is to continue with capping trials titrate oxygen continue pulmonary toilet. 2. Facial trauma no change supportive care seen by ENT 3. Tracheostomy will remain in place 4. Pulmonary atelectasis continue pulmonary toilet   I have personally seen and evaluated the patient, evaluated laboratory and imaging results, formulated the assessment and plan and placed orders. The Patient requires high complexity decision making with multiple systems involvement.  Rounds were done with the Respiratory Therapy Director and Staff therapists and discussed with nursing staff also.  Yevonne Pax, MD [facility] Pulmonary Critical Care Medicine Sleep Medicine

## 2020-01-15 DIAGNOSIS — Z930 Tracheostomy status: Secondary | ICD-10-CM | POA: Diagnosis not present

## 2020-01-15 DIAGNOSIS — J9811 Atelectasis: Secondary | ICD-10-CM | POA: Diagnosis not present

## 2020-01-15 DIAGNOSIS — S0993XS Unspecified injury of face, sequela: Secondary | ICD-10-CM | POA: Diagnosis not present

## 2020-01-15 DIAGNOSIS — J9621 Acute and chronic respiratory failure with hypoxia: Secondary | ICD-10-CM | POA: Diagnosis not present

## 2020-01-15 NOTE — Progress Notes (Signed)
Pulmonary Critical Care Medicine [facility] [place]   PULMONARY CRITICAL CARE SERVICE  PROGRESS NOTE  Date of Service: 01/15/2020  Brian Farley  [id number]:[id number]  DOB: [birth year]   DOA: 01/13/2020  Referring Physician: Carron Curie, MD  HPI: Brian Farley is a 54 y.o. male seen for follow up of Acute on Chronic Respiratory Failure.  Patient is doing well with capping remains on room air  Medications: Reviewed on Rounds  Physical Exam:  Vitals: Temperature is 97.2 pulse 68 respiratory rate 20 blood pressure is 121/66 saturations 97%  Ventilator Settings capping on room air  . General: Comfortable at this time . Eyes: Grossly normal lids, irises & conjunctiva . ENT: grossly tongue is normal . Neck: no obvious mass . Cardiovascular: S1 S2 normal no gallop . Respiratory: No rhonchi coarse breath sounds . Abdomen: soft . Skin: no rash seen on limited exam . Musculoskeletal: not rigid . Psychiatric:unable to assess . Neurologic: no seizure no involuntary movements         Lab Data:   Basic Metabolic Panel: No results for input(s): NA, K, CL, CO2, GLUCOSE, BUN, CREATININE, CALCIUM, MG, PHOS in the last 168 hours.  ABG: No results for input(s): PHART, PCO2ART, PO2ART, HCO3, O2SAT in the last 168 hours.  Liver Function Tests: No results for input(s): AST, ALT, ALKPHOS, BILITOT, PROT, ALBUMIN in the last 168 hours. No results for input(s): LIPASE, AMYLASE in the last 168 hours. No results for input(s): AMMONIA in the last 168 hours.  CBC: No results for input(s): WBC, NEUTROABS, HGB, HCT, MCV, PLT in the last 168 hours.  Cardiac Enzymes: No results for input(s): CKTOTAL, CKMB, CKMBINDEX, TROPONINI in the last 168 hours.  BNP (last 3 results) No results for input(s): BNP in the last 8760 hours.  ProBNP (last 3 results) No results for input(s): PROBNP in the last 8760 hours.  Radiological Exams: No results found.  Assessment/Plan Active Problems:    Acute on chronic respiratory failure with hypoxia (HCC)   Facial trauma, sequela   Tracheostomy status (HCC)   Pulmonary atelectasis   1. Acute on chronic respiratory failure hypoxia we will continue with capping trials patient is at baseline right now until his jaw is taken care of 2. Facial trauma no change we will continue with supportive care 3. Pulmonary atelectasis continue pulmonary toilet 4. Tracheostomy remains in place at this time   I have personally seen and evaluated the patient, evaluated laboratory and imaging results, formulated the assessment and plan and placed orders. The Patient requires high complexity decision making with multiple systems involvement.  Rounds were done with the Respiratory Therapy Director and Staff therapists and discussed with nursing staff also.  Yevonne Pax, MD [facility] Pulmonary Critical Care Medicine Sleep Medicine

## 2020-01-16 DIAGNOSIS — J9621 Acute and chronic respiratory failure with hypoxia: Secondary | ICD-10-CM | POA: Diagnosis not present

## 2020-01-16 DIAGNOSIS — J9811 Atelectasis: Secondary | ICD-10-CM | POA: Diagnosis not present

## 2020-01-16 DIAGNOSIS — Z930 Tracheostomy status: Secondary | ICD-10-CM | POA: Diagnosis not present

## 2020-01-16 DIAGNOSIS — S0993XS Unspecified injury of face, sequela: Secondary | ICD-10-CM | POA: Diagnosis not present

## 2020-01-16 NOTE — Progress Notes (Signed)
Pulmonary Critical Care Medicine [facility] [place]   PULMONARY CRITICAL CARE SERVICE  PROGRESS NOTE  Date of Service: 01/16/2020  Brian Farley  [id number]:[id number]  DOB: 24-Dec-1965   DOA: 01/13/2020  Referring Physician: Carron Curie, MD  HPI: Brian Farley is a 54 y.o. male seen for follow up of Acute on Chronic Respiratory Failure.  Patient is doing well with capping trials right now is on room air no distress  Medications: Reviewed on Rounds  Physical Exam:  Vitals: Temperature 97.6 pulse 87 respiratory rate 18 blood pressure is 110/62 saturations 97%  Ventilator Settings capping on room air  . General: Comfortable at this time . Eyes: Grossly normal lids, irises & conjunctiva . ENT: grossly tongue is normal . Neck: no obvious mass . Cardiovascular: S1 S2 normal no gallop . Respiratory: No rhonchi no rales are noted at this time . Abdomen: soft . Skin: no rash seen on limited exam . Musculoskeletal: not rigid . Psychiatric:unable to assess . Neurologic: no seizure no involuntary movements         Lab Data:   Basic Metabolic Panel: No results for input(s): NA, K, CL, CO2, GLUCOSE, BUN, CREATININE, CALCIUM, MG, PHOS in the last 168 hours.  ABG: No results for input(s): PHART, PCO2ART, PO2ART, HCO3, O2SAT in the last 168 hours.  Liver Function Tests: No results for input(s): AST, ALT, ALKPHOS, BILITOT, PROT, ALBUMIN in the last 168 hours. No results for input(s): LIPASE, AMYLASE in the last 168 hours. No results for input(s): AMMONIA in the last 168 hours.  CBC: No results for input(s): WBC, NEUTROABS, HGB, HCT, MCV, PLT in the last 168 hours.  Cardiac Enzymes: No results for input(s): CKTOTAL, CKMB, CKMBINDEX, TROPONINI in the last 168 hours.  BNP (last 3 results) No results for input(s): BNP in the last 8760 hours.  ProBNP (last 3 results) No results for input(s): PROBNP in the last 8760 hours.  Radiological Exams: No results  found.  Assessment/Plan Active Problems:   Acute on chronic respiratory failure with hypoxia (HCC)   Facial trauma, sequela   Tracheostomy status (HCC)   Pulmonary atelectasis   1. Acute on chronic respiratory failure hypoxia plan is to continue with capping trials as ordered 2. Facial trauma sequelae supportive care patient needs follow-up with ENT 3. Tracheostomy will remain in place 4. Pulmonary atelectasis no change continue with supportive care   I have personally seen and evaluated the patient, evaluated laboratory and imaging results, formulated the assessment and plan and placed orders. The Patient requires high complexity decision making with multiple systems involvement.  Rounds were done with the Respiratory Therapy Director and Staff therapists and discussed with nursing staff also.  Yevonne Pax, MD [facility] Pulmonary Critical Care Medicine Sleep Medicine

## 2020-01-17 DIAGNOSIS — J9621 Acute and chronic respiratory failure with hypoxia: Secondary | ICD-10-CM | POA: Diagnosis not present

## 2020-01-17 DIAGNOSIS — S0993XS Unspecified injury of face, sequela: Secondary | ICD-10-CM | POA: Diagnosis not present

## 2020-01-17 DIAGNOSIS — Z930 Tracheostomy status: Secondary | ICD-10-CM | POA: Diagnosis not present

## 2020-01-17 DIAGNOSIS — J9811 Atelectasis: Secondary | ICD-10-CM | POA: Diagnosis not present

## 2020-01-17 NOTE — Progress Notes (Signed)
Pulmonary Critical Care Medicine [facility] [place]   PULMONARY CRITICAL CARE SERVICE  PROGRESS NOTE  Date of Service: 01/17/2020  Brian Farley  [id number]:[id number]  DOB: 13-Jul-1966   DOA: 01/13/2020  Referring Physician: Carron Curie, MD  HPI: Brian Farley is a 54 y.o. male seen for follow up of Acute on Chronic Respiratory Failure.  Patient is capping currently is on room air without any distress  Medications: Reviewed on Rounds  Physical Exam:  Vitals: Temperature 97.2 pulse 60 respiratory 20 blood pressure is 104/60 saturations 100%  Ventilator Settings capping right now on room air without distress  . General: Comfortable at this time . Eyes: Grossly normal lids, irises & conjunctiva . ENT: grossly tongue is normal . Neck: no obvious mass . Cardiovascular: S1 S2 normal no gallop . Respiratory: No rhonchi, rales are noted at this time . Abdomen: soft . Skin: no rash seen on limited exam . Musculoskeletal: not rigid . Psychiatric:unable to assess . Neurologic: no seizure no involuntary movements         Lab Data:   Basic Metabolic Panel: No results for input(s): NA, K, CL, CO2, GLUCOSE, BUN, CREATININE, CALCIUM, MG, PHOS in the last 168 hours.  ABG: No results for input(s): PHART, PCO2ART, PO2ART, HCO3, O2SAT in the last 168 hours.  Liver Function Tests: No results for input(s): AST, ALT, ALKPHOS, BILITOT, PROT, ALBUMIN in the last 168 hours. No results for input(s): LIPASE, AMYLASE in the last 168 hours. No results for input(s): AMMONIA in the last 168 hours.  CBC: No results for input(s): WBC, NEUTROABS, HGB, HCT, MCV, PLT in the last 168 hours.  Cardiac Enzymes: No results for input(s): CKTOTAL, CKMB, CKMBINDEX, TROPONINI in the last 168 hours.  BNP (last 3 results) No results for input(s): BNP in the last 8760 hours.  ProBNP (last 3 results) No results for input(s): PROBNP in the last 8760 hours.  Radiological Exams: No results  found.  Assessment/Plan Active Problems:   Acute on chronic respiratory failure with hypoxia (HCC)   Facial trauma, sequela   Tracheostomy status (HCC)   Pulmonary atelectasis   1. Acute on chronic respiratory failure with oxygen we will continue with capping awaiting discharge planning and follow-up with ENT 2. Facial trauma follow-up with ENT 3. Tracheostomy will remain in place 4. Pulmonary atelectasis needs ongoing pulmonary toilet secretions are minimal right   I have personally seen and evaluated the patient, evaluated laboratory and imaging results, formulated the assessment and plan and placed orders. The Patient requires high complexity decision making with multiple systems involvement.  Rounds were done with the Respiratory Therapy Director and Staff therapists and discussed with nursing staff also.  Yevonne Pax, MD [facility] Pulmonary Critical Care Medicine Sleep Medicine

## 2020-01-18 DIAGNOSIS — S0993XS Unspecified injury of face, sequela: Secondary | ICD-10-CM | POA: Diagnosis not present

## 2020-01-18 DIAGNOSIS — J9621 Acute and chronic respiratory failure with hypoxia: Secondary | ICD-10-CM | POA: Diagnosis not present

## 2020-01-18 DIAGNOSIS — J9811 Atelectasis: Secondary | ICD-10-CM | POA: Diagnosis not present

## 2020-01-18 DIAGNOSIS — Z930 Tracheostomy status: Secondary | ICD-10-CM | POA: Diagnosis not present

## 2020-01-18 NOTE — Progress Notes (Addendum)
Pulmonary Critical Care Medicine [facility] [place]   PULMONARY CRITICAL CARE SERVICE  PROGRESS NOTE  Date of Service: 01/18/2020  Brian Farley  PFX:[id number]  DOB: October 08, 1965   DOA: 01/13/2020  Referring Physician: Carron Curie, MD  HPI: Brian Farley is a 54 y.o. male seen for follow up of Acute on Chronic Respiratory Failure.  Patient remains On room air at this time satting well with no fever distress.  Medications: Reviewed on Rounds  Physical Exam:  Vitals: Pulse 73 respirations 21 BP 143/75 O2 sat 96% temp 97.5  Ventilator Settings room air  . General: Comfortable at this time . Eyes: Grossly normal lids, irises & conjunctiva . ENT: grossly tongue is normal . Neck: no obvious mass . Cardiovascular: S1 S2 normal no gallop . Respiratory: No rales or rhonchi noted . Abdomen: soft . Skin: no rash seen on limited exam . Musculoskeletal: not rigid . Psychiatric:unable to assess . Neurologic: no seizure no involuntary movements         Lab Data:   Basic Metabolic Panel: No results for input(s): NA, K, CL, CO2, GLUCOSE, BUN, CREATININE, CALCIUM, MG, PHOS in the last 168 hours.  ABG: No results for input(s): PHART, PCO2ART, PO2ART, HCO3, O2SAT in the last 168 hours.  Liver Function Tests: No results for input(s): AST, ALT, ALKPHOS, BILITOT, PROT, ALBUMIN in the last 168 hours. No results for input(s): LIPASE, AMYLASE in the last 168 hours. No results for input(s): AMMONIA in the last 168 hours.  CBC: No results for input(s): WBC, NEUTROABS, HGB, HCT, MCV, PLT in the last 168 hours.  Cardiac Enzymes: No results for input(s): CKTOTAL, CKMB, CKMBINDEX, TROPONINI in the last 168 hours.  BNP (last 3 results) No results for input(s): BNP in the last 8760 hours.  ProBNP (last 3 results) No results for input(s): PROBNP in the last 8760 hours.  Radiological Exams: No results found.  Assessment/Plan Active Problems:   Acute on chronic respiratory  failure with hypoxia (HCC)   Facial trauma, sequela   Tracheostomy status (HCC)   Pulmonary atelectasis   1. Acute on chronic respiratory failure with oxygen we will continue with capping awaiting discharge planning and follow-up with ENT 2. Facial trauma follow-up with ENT 3. Tracheostomy will remain in place 4. Pulmonary atelectasis needs ongoing pulmonary toilet secretions are minimal right   I have personally seen and evaluated the patient, evaluated laboratory and imaging results, formulated the assessment and plan and placed orders. The Patient requires high complexity decision making with multiple systems involvement.  Rounds were done with the Respiratory Therapy Director and Staff therapists and discussed with nursing staff also.  Yevonne Pax, MD [facility] Pulmonary Critical Care Medicine Sleep Medicine

## 2020-01-19 DIAGNOSIS — J9811 Atelectasis: Secondary | ICD-10-CM | POA: Diagnosis not present

## 2020-01-19 DIAGNOSIS — S0993XS Unspecified injury of face, sequela: Secondary | ICD-10-CM | POA: Diagnosis not present

## 2020-01-19 DIAGNOSIS — Z930 Tracheostomy status: Secondary | ICD-10-CM | POA: Diagnosis not present

## 2020-01-19 DIAGNOSIS — J9621 Acute and chronic respiratory failure with hypoxia: Secondary | ICD-10-CM | POA: Diagnosis not present

## 2020-01-19 NOTE — Progress Notes (Signed)
Pulmonary Critical Care Medicine [facility] [place]   PULMONARY CRITICAL CARE SERVICE  PROGRESS NOTE  Date of Service: 01/19/2020  Brian Farley  [id number]:[id number]  DOB: 1966/03/22   DOA: 01/13/2020  Referring Physician: Carron Curie, MD  HPI: Brian Farley is a 54 y.o. male seen for follow up of Acute on Chronic Respiratory Failure.  Patient is capping right now on room air with good saturations  Medications: Reviewed on Rounds  Physical Exam:  Vitals: Temperature is 97.9 pulse 74 respiratory rate 19 blood pressure is 132/75 saturations 96%  Ventilator Settings capping of the ventilator  . General: Comfortable at this time . Eyes: Grossly normal lids, irises & conjunctiva . ENT: grossly tongue is normal . Neck: no obvious mass . Cardiovascular: S1 S2 normal no gallop . Respiratory: No rhonchi no rales are noted at this time . Abdomen: soft . Skin: no rash seen on limited exam . Musculoskeletal: not rigid . Psychiatric:unable to assess . Neurologic: no seizure no involuntary movements         Lab Data:   Basic Metabolic Panel: No results for input(s): NA, K, CL, CO2, GLUCOSE, BUN, CREATININE, CALCIUM, MG, PHOS in the last 168 hours.  ABG: No results for input(s): PHART, PCO2ART, PO2ART, HCO3, O2SAT in the last 168 hours.  Liver Function Tests: No results for input(s): AST, ALT, ALKPHOS, BILITOT, PROT, ALBUMIN in the last 168 hours. No results for input(s): LIPASE, AMYLASE in the last 168 hours. No results for input(s): AMMONIA in the last 168 hours.  CBC: No results for input(s): WBC, NEUTROABS, HGB, HCT, MCV, PLT in the last 168 hours.  Cardiac Enzymes: No results for input(s): CKTOTAL, CKMB, CKMBINDEX, TROPONINI in the last 168 hours.  BNP (last 3 results) No results for input(s): BNP in the last 8760 hours.  ProBNP (last 3 results) No results for input(s): PROBNP in the last 8760 hours.  Radiological Exams: No results  found.  Assessment/Plan Active Problems:   Acute on chronic respiratory failure with hypoxia (HCC)   Facial trauma, sequela   Tracheostomy status (HCC)   Pulmonary atelectasis   1. Acute on chronic respiratory failure with hypoxia we will continue with capping following up with ENT. 2. Facial trauma no change supportive care 3. Tracheostomy remains in place we will continue supportive care 4. Pulmonary atelectasis at baseline supportive care pulmonary toilet   I have personally seen and evaluated the patient, evaluated laboratory and imaging results, formulated the assessment and plan and placed orders. The Patient requires high complexity decision making with multiple systems involvement.  Rounds were done with the Respiratory Therapy Director and Staff therapists and discussed with nursing staff also.  Yevonne Pax, MD [facility] Pulmonary Critical Care Medicine Sleep Medicine

## 2020-01-20 DIAGNOSIS — J9811 Atelectasis: Secondary | ICD-10-CM | POA: Diagnosis not present

## 2020-01-20 DIAGNOSIS — S0993XS Unspecified injury of face, sequela: Secondary | ICD-10-CM | POA: Diagnosis not present

## 2020-01-20 DIAGNOSIS — Z930 Tracheostomy status: Secondary | ICD-10-CM | POA: Diagnosis not present

## 2020-01-20 DIAGNOSIS — J9621 Acute and chronic respiratory failure with hypoxia: Secondary | ICD-10-CM | POA: Diagnosis not present

## 2020-01-20 NOTE — Progress Notes (Addendum)
Pulmonary Critical Care Medicine [facility] [place]   PULMONARY CRITICAL CARE SERVICE  PROGRESS NOTE  Date of Service: 01/20/2020  Brian Farley  PFX:[id number]  DOB: 1966/03/08   DOA: 01/13/2020  Referring Physician: Carron Curie, MD  HPI: Brian Farley is a 54 y.o. male seen for follow up of Acute on Chronic Respiratory Failure. Pt remains on Room air and capped at this time.   Medications: Reviewed on Rounds  Physical Exam:  Vitals: Pulse 76 respirations 16 BP 139/75 O2 sat 96% temp 97.4  Ventilator Settings room air capped  . General: Comfortable at this time . Eyes: Grossly normal lids, irises & conjunctiva . ENT: grossly tongue is normal . Neck: no obvious mass . Cardiovascular: S1 S2 normal no gallop . Respiratory: No rales or rhonchi noted . Abdomen: soft . Skin: no rash seen on limited exam . Musculoskeletal: not rigid . Psychiatric:unable to assess . Neurologic: no seizure no involuntary movements         Lab Data:   Basic Metabolic Panel: No results for input(s): NA, K, CL, CO2, GLUCOSE, BUN, CREATININE, CALCIUM, MG, PHOS in the last 168 hours.  ABG: No results for input(s): PHART, PCO2ART, PO2ART, HCO3, O2SAT in the last 168 hours.  Liver Function Tests: No results for input(s): AST, ALT, ALKPHOS, BILITOT, PROT, ALBUMIN in the last 168 hours. No results for input(s): LIPASE, AMYLASE in the last 168 hours. No results for input(s): AMMONIA in the last 168 hours.  CBC: No results for input(s): WBC, NEUTROABS, HGB, HCT, MCV, PLT in the last 168 hours.  Cardiac Enzymes: No results for input(s): CKTOTAL, CKMB, CKMBINDEX, TROPONINI in the last 168 hours.  BNP (last 3 results) No results for input(s): BNP in the last 8760 hours.  ProBNP (last 3 results) No results for input(s): PROBNP in the last 8760 hours.  Radiological Exams: No results found.  Assessment/Plan Active Problems:   Acute on chronic respiratory failure with hypoxia  (HCC)   Facial trauma, sequela   Tracheostomy status (HCC)   Pulmonary atelectasis   1. Acute on chronic respiratory failure with hypoxia we will continue with capping following up with ENT. 2. Facial trauma no change supportive care 3. Tracheostomy remains in place we will continue supportive care 4. Pulmonary atelectasis at baseline supportive care pulmonary toilet   I have personally seen and evaluated the patient, evaluated laboratory and imaging results, formulated the assessment and plan and placed orders. The Patient requires high complexity decision making with multiple systems involvement.  Rounds were done with the Respiratory Therapy Director and Staff therapists and discussed with nursing staff also.  Yevonne Pax, MD [facility] Pulmonary Critical Care Medicine Sleep Medicine

## 2020-01-21 DIAGNOSIS — J9811 Atelectasis: Secondary | ICD-10-CM | POA: Diagnosis not present

## 2020-01-21 DIAGNOSIS — Z930 Tracheostomy status: Secondary | ICD-10-CM | POA: Diagnosis not present

## 2020-01-21 DIAGNOSIS — J9621 Acute and chronic respiratory failure with hypoxia: Secondary | ICD-10-CM | POA: Diagnosis not present

## 2020-01-21 DIAGNOSIS — S0993XS Unspecified injury of face, sequela: Secondary | ICD-10-CM | POA: Diagnosis not present

## 2020-01-21 LAB — CBC
HCT: 36.8 % — ABNORMAL LOW (ref 39.0–52.0)
Hemoglobin: 11.8 g/dL — ABNORMAL LOW (ref 13.0–17.0)
MCH: 28.4 pg (ref 26.0–34.0)
MCHC: 32.1 g/dL (ref 30.0–36.0)
MCV: 88.7 fL (ref 80.0–100.0)
Platelets: 277 10*3/uL (ref 150–400)
RBC: 4.15 MIL/uL — ABNORMAL LOW (ref 4.22–5.81)
RDW: 13.1 % (ref 11.5–15.5)
WBC: 6.1 10*3/uL (ref 4.0–10.5)
nRBC: 0 % (ref 0.0–0.2)

## 2020-01-21 LAB — BASIC METABOLIC PANEL
Anion gap: 9 (ref 5–15)
BUN: 10 mg/dL (ref 6–20)
CO2: 30 mmol/L (ref 22–32)
Calcium: 9.3 mg/dL (ref 8.9–10.3)
Chloride: 100 mmol/L (ref 98–111)
Creatinine, Ser: 0.63 mg/dL (ref 0.61–1.24)
GFR calc Af Amer: >60 mL/min (ref >60)
GFR calc non Af Amer: >60 mL/min (ref >60)
Glucose, Bld: 113 mg/dL — ABNORMAL HIGH (ref 70–99)
Potassium: 4 mmol/L (ref 3.5–5.1)
Sodium: 139 mmol/L (ref 135–145)

## 2020-01-21 NOTE — Progress Notes (Signed)
Pulmonary Critical Care Medicine [facility] [place]   PULMONARY CRITICAL CARE SERVICE  PROGRESS NOTE  Date of Service: 01/21/2020  Brian Farley  [id number]  DOB: [birth year]   DOA: 01/13/2020  Referring Physician: Carron Curie, MD  HPI: Brian Farley is a 54 y.o. male seen for follow up of Acute on Chronic Respiratory Failure.  Patient is capping right now on room air good saturations  Medications: Reviewed on Rounds  Physical Exam:  Vitals: Temperature 97.5 pulse 71 respiratory 18 blood pressure is 146/75 saturations 97%  Ventilator Settings capping off the vent  . General: Comfortable at this time . Eyes: Grossly normal lids, irises & conjunctiva . ENT: grossly tongue is normal . Neck: no obvious mass . Cardiovascular: S1 S2 normal no gallop . Respiratory: No rhonchi no rales noted . Abdomen: soft . Skin: no rash seen on limited exam . Musculoskeletal: not rigid . Psychiatric:unable to assess . Neurologic: no seizure no involuntary movements         Lab Data:   Basic Metabolic Panel: Recent Labs  Lab 01/21/20 0642  NA 139  K 4.0  CL 100  CO2 30  GLUCOSE 113*  BUN 10  CREATININE 0.63  CALCIUM 9.3    ABG: No results for input(s): PHART, PCO2ART, PO2ART, HCO3, O2SAT in the last 168 hours.  Liver Function Tests: No results for input(s): AST, ALT, ALKPHOS, BILITOT, PROT, ALBUMIN in the last 168 hours. No results for input(s): LIPASE, AMYLASE in the last 168 hours. No results for input(s): AMMONIA in the last 168 hours.  CBC: Recent Labs  Lab 01/21/20 0642  WBC 6.1  HGB 11.8*  HCT 36.8*  MCV 88.7  PLT 277    Cardiac Enzymes: No results for input(s): CKTOTAL, CKMB, CKMBINDEX, TROPONINI in the last 168 hours.  BNP (last 3 results) No results for input(s): BNP in the last 8760 hours.  ProBNP (last 3 results) No results for input(s): PROBNP in the last 8760 hours.  Radiological Exams: No results found.  Assessment/Plan Active  Problems:   Acute on chronic respiratory failure with hypoxia (HCC)   Facial trauma, sequela   Tracheostomy status (HCC)   Pulmonary atelectasis   1. Acute on chronic respiratory failure hypoxia we will continue with capping currently on room air 2. Facial trauma being followed by the surgery 3. Tracheostomy remain in place 4. Pulmonary atelectasis no change supportive care   I have personally seen and evaluated the patient, evaluated laboratory and imaging results, formulated the assessment and plan and placed orders. The Patient requires high complexity decision making with multiple systems involvement.  Rounds were done with the Respiratory Therapy Director and Staff therapists and discussed with nursing staff also.  Yevonne Pax, MD [facility] Pulmonary Critical Care Medicine Sleep Medicine

## 2020-01-22 DIAGNOSIS — Z930 Tracheostomy status: Secondary | ICD-10-CM | POA: Diagnosis not present

## 2020-01-22 DIAGNOSIS — S0993XS Unspecified injury of face, sequela: Secondary | ICD-10-CM | POA: Diagnosis not present

## 2020-01-22 DIAGNOSIS — J9621 Acute and chronic respiratory failure with hypoxia: Secondary | ICD-10-CM | POA: Diagnosis not present

## 2020-01-22 DIAGNOSIS — J9811 Atelectasis: Secondary | ICD-10-CM | POA: Diagnosis not present

## 2020-01-22 NOTE — Progress Notes (Signed)
Pulmonary Critical Care Medicine [facility] [place]   PULMONARY CRITICAL CARE SERVICE  PROGRESS NOTE  Date of Service: 01/22/2020  Brian Farley  KDT:[id number]  DOB: [birth year]   DOA: 01/13/2020  Referring Physician: Carron Curie, MD  HPI: Brian Farley is a 54 y.o. male seen for follow up of Acute on Chronic Respiratory Failure.  Currently capping without distress  Medications: Reviewed on Rounds  Physical Exam:  Vitals: Temperature is 97.1 pulse 88 respiratory 20 blood pressure is 112/66 saturations 95%  Ventilator Settings capping of the ventilator on room air  . General: Comfortable at this time . Eyes: Grossly normal lids, irises & conjunctiva . ENT: grossly tongue is normal . Neck: no obvious mass . Cardiovascular: S1 S2 normal no gallop . Respiratory: No rhonchi no rales noted at this time . Abdomen: soft . Skin: no rash seen on limited exam . Musculoskeletal: not rigid . Psychiatric:unable to assess . Neurologic: no seizure no involuntary movements         Lab Data:   Basic Metabolic Panel: Recent Labs  Lab 01/21/20 0642  NA 139  K 4.0  CL 100  CO2 30  GLUCOSE 113*  BUN 10  CREATININE 0.63  CALCIUM 9.3    ABG: No results for input(s): PHART, PCO2ART, PO2ART, HCO3, O2SAT in the last 168 hours.  Liver Function Tests: No results for input(s): AST, ALT, ALKPHOS, BILITOT, PROT, ALBUMIN in the last 168 hours. No results for input(s): LIPASE, AMYLASE in the last 168 hours. No results for input(s): AMMONIA in the last 168 hours.  CBC: Recent Labs  Lab 01/21/20 0642  WBC 6.1  HGB 11.8*  HCT 36.8*  MCV 88.7  PLT 277    Cardiac Enzymes: No results for input(s): CKTOTAL, CKMB, CKMBINDEX, TROPONINI in the last 168 hours.  BNP (last 3 results) No results for input(s): BNP in the last 8760 hours.  ProBNP (last 3 results) No results for input(s): PROBNP in the last 8760 hours.  Radiological Exams: No results  found.  Assessment/Plan Active Problems:   Acute on chronic respiratory failure with hypoxia (HCC)   Facial trauma, sequela   Tracheostomy status (HCC)   Pulmonary atelectasis   1. Acute on chronic respiratory failure hypoxia doing fine with capping which will be continued 2. Pulmonary atelectasis continue aggressive pulmonary toilet 3. Facial trauma being followed by ENT 4. Tracheostomy will remain in place right now   I have personally seen and evaluated the patient, evaluated laboratory and imaging results, formulated the assessment and plan and placed orders. The Patient requires high complexity decision making with multiple systems involvement.  Rounds were done with the Respiratory Therapy Director and Staff therapists and discussed with nursing staff also.  Yevonne Pax, MD [facility] Pulmonary Critical Care Medicine Sleep Medicine

## 2020-01-23 DIAGNOSIS — Z930 Tracheostomy status: Secondary | ICD-10-CM | POA: Diagnosis not present

## 2020-01-23 DIAGNOSIS — J9621 Acute and chronic respiratory failure with hypoxia: Secondary | ICD-10-CM | POA: Diagnosis not present

## 2020-01-23 DIAGNOSIS — J9811 Atelectasis: Secondary | ICD-10-CM | POA: Diagnosis not present

## 2020-01-23 DIAGNOSIS — S0993XS Unspecified injury of face, sequela: Secondary | ICD-10-CM | POA: Diagnosis not present

## 2020-01-23 NOTE — Progress Notes (Signed)
Pulmonary Critical Care Medicine [facility] [place]   PULMONARY CRITICAL CARE SERVICE  PROGRESS NOTE  Date of Service: 01/23/2020  Brian Farley  FAO:[id number]  DOB: March 11, 1966   DOA: 01/13/2020  Referring Physician: Carron Curie, MD  HPI: Brian Farley is a 54 y.o. male seen for follow up of Acute on Chronic Respiratory Failure.  Currently is capping on room air looks good awaiting discharge  Medications: Reviewed on Rounds  Physical Exam:  Vitals: Temperature is 97.5 pulse 77 respiratory 20 blood pressure is 98/54 saturations 97%  Ventilator Settings capping on room air  . General: Comfortable at this time . Eyes: Grossly normal lids, irises & conjunctiva . ENT: grossly tongue is normal . Neck: no obvious mass . Cardiovascular: S1 S2 normal no gallop . Respiratory: No rhonchi no rales are noted at this time . Abdomen: soft . Skin: no rash seen on limited exam . Musculoskeletal: not rigid . Psychiatric:unable to assess . Neurologic: no seizure no involuntary movements         Lab Data:   Basic Metabolic Panel: Recent Labs  Lab 01/21/20 0642  NA 139  K 4.0  CL 100  CO2 30  GLUCOSE 113*  BUN 10  CREATININE 0.63  CALCIUM 9.3    ABG: No results for input(s): PHART, PCO2ART, PO2ART, HCO3, O2SAT in the last 168 hours.  Liver Function Tests: No results for input(s): AST, ALT, ALKPHOS, BILITOT, PROT, ALBUMIN in the last 168 hours. No results for input(s): LIPASE, AMYLASE in the last 168 hours. No results for input(s): AMMONIA in the last 168 hours.  CBC: Recent Labs  Lab 01/21/20 0642  WBC 6.1  HGB 11.8*  HCT 36.8*  MCV 88.7  PLT 277    Cardiac Enzymes: No results for input(s): CKTOTAL, CKMB, CKMBINDEX, TROPONINI in the last 168 hours.  BNP (last 3 results) No results for input(s): BNP in the last 8760 hours.  ProBNP (last 3 results) No results for input(s): PROBNP in the last 8760 hours.  Radiological Exams: No results  found.  Assessment/Plan Active Problems:   Acute on chronic respiratory failure with hypoxia (HCC)   Facial trauma, sequela   Tracheostomy status (HCC)   Pulmonary atelectasis   1. Acute on chronic respiratory failure hypoxia we will continue with capping trials titrate oxygen continue pulmonary toilet 2. Facial trauma no change supportive care 3. Tracheostomy remains in place 4. Pulmonary atelectasis at baseline   I have personally seen and evaluated the patient, evaluated laboratory and imaging results, formulated the assessment and plan and placed orders. The Patient requires high complexity decision making with multiple systems involvement.  Rounds were done with the Respiratory Therapy Director and Staff therapists and discussed with nursing staff also.  Yevonne Pax, MD [facility] Pulmonary Critical Care Medicine Sleep Medicine

## 2020-01-24 DIAGNOSIS — J9621 Acute and chronic respiratory failure with hypoxia: Secondary | ICD-10-CM | POA: Diagnosis not present

## 2020-01-24 DIAGNOSIS — Z930 Tracheostomy status: Secondary | ICD-10-CM | POA: Diagnosis not present

## 2020-01-24 DIAGNOSIS — S0993XS Unspecified injury of face, sequela: Secondary | ICD-10-CM | POA: Diagnosis not present

## 2020-01-24 DIAGNOSIS — J9811 Atelectasis: Secondary | ICD-10-CM | POA: Diagnosis not present

## 2020-01-24 NOTE — Progress Notes (Signed)
Pulmonary Critical Care Medicine [facility] [place]   PULMONARY CRITICAL CARE SERVICE  PROGRESS NOTE  Date of Service: 01/24/2020  Brian Farley  UEA:[id number]  DOB: 1966/08/11   DOA: 01/13/2020  Referring Physician: Carron Curie, MD  HPI: Brian Farley is a 54 y.o. male seen for follow up of Acute on Chronic Respiratory Failure.  Patient is capping currently on room air good saturations are noted  Medications: Reviewed on Rounds  Physical Exam:  Vitals: Temperature is 97.3 pulse 76 respiratory rate 16 blood pressure is 100/52 saturations 96%  Ventilator Settings capping on room air   General: Comfortable at this time  Eyes: Grossly normal lids, irises & conjunctiva  ENT: grossly tongue is normal  Neck: no obvious mass  Cardiovascular: S1 S2 normal no gallop  Respiratory: No rhonchi coarse breath sounds  Abdomen: soft  Skin: no rash seen on limited exam  Musculoskeletal: not rigid  Psychiatric:unable to assess  Neurologic: no seizure no involuntary movements         Lab Data:   Basic Metabolic Panel: Recent Labs  Lab 01/21/20 0642  NA 139  K 4.0  CL 100  CO2 30  GLUCOSE 113*  BUN 10  CREATININE 0.63  CALCIUM 9.3    ABG: No results for input(s): PHART, PCO2ART, PO2ART, HCO3, O2SAT in the last 168 hours.  Liver Function Tests: No results for input(s): AST, ALT, ALKPHOS, BILITOT, PROT, ALBUMIN in the last 168 hours. No results for input(s): LIPASE, AMYLASE in the last 168 hours. No results for input(s): AMMONIA in the last 168 hours.  CBC: Recent Labs  Lab 01/21/20 0642  WBC 6.1  HGB 11.8*  HCT 36.8*  MCV 88.7  PLT 277    Cardiac Enzymes: No results for input(s): CKTOTAL, CKMB, CKMBINDEX, TROPONINI in the last 168 hours.  BNP (last 3 results) No results for input(s): BNP in the last 8760 hours.  ProBNP (last 3 results) No results for input(s): PROBNP in the last 8760 hours.  Radiological Exams: No results  found.  Assessment/Plan Active Problems:   Acute on chronic respiratory failure with hypoxia (HCC)   Facial trauma, sequela   Tracheostomy status (HCC)   Pulmonary atelectasis   1. Acute on chronic respiratory failure hypoxia we will continue with the capping process.  Patient is tolerating being on room air well 2. Facial trauma no change supportive care to follow-up with ENT 3. Tracheostomy will remain in place until cleared by ENT 4. Pulmonary atelectasis no secretions continue pulmonary toilet   I have personally seen and evaluated the patient, evaluated laboratory and imaging results, formulated the assessment and plan and placed orders. The Patient requires high complexity decision making with multiple systems involvement.  Rounds were done with the Respiratory Therapy Director and Staff therapists and discussed with nursing staff also.  Yevonne Pax, MD [facility] Pulmonary Critical Care Medicine Sleep Medicine

## 2020-01-26 DIAGNOSIS — J9621 Acute and chronic respiratory failure with hypoxia: Secondary | ICD-10-CM | POA: Diagnosis not present

## 2020-01-26 DIAGNOSIS — S0993XS Unspecified injury of face, sequela: Secondary | ICD-10-CM | POA: Diagnosis not present

## 2020-01-26 DIAGNOSIS — J9811 Atelectasis: Secondary | ICD-10-CM | POA: Diagnosis not present

## 2020-01-26 DIAGNOSIS — Z930 Tracheostomy status: Secondary | ICD-10-CM | POA: Diagnosis not present

## 2020-01-26 NOTE — Progress Notes (Signed)
Pulmonary Critical Care Medicine [facility] [place]   PULMONARY CRITICAL CARE SERVICE  PROGRESS NOTE  Date of Service: 01/26/2020  Brian Farley  TDD:[id number]  DOB: 1966-07-13   DOA: 01/13/2020  Referring Physician: Carron Curie, MD  HPI: Brian Farley is a 54 y.o. male seen for follow up of Acute on Chronic Respiratory Failure.  Capping right now patient is on room air without any distress  Medications: Reviewed on Rounds  Physical Exam:  Vitals: Temperature is 97.2 pulse 77 respiratory rate 20 blood pressure is 120/66 saturations 97%  Ventilator Settings capping on room air  . General: Comfortable at this time . Eyes: Grossly normal lids, irises & conjunctiva . ENT: grossly tongue is normal . Neck: no obvious mass . Cardiovascular: S1 S2 normal no gallop . Respiratory: No rhonchi coarse breath sounds . Abdomen: soft . Skin: no rash seen on limited exam . Musculoskeletal: not rigid . Psychiatric:unable to assess . Neurologic: no seizure no involuntary movements         Lab Data:   Basic Metabolic Panel: Recent Labs  Lab 01/21/20 0642  NA 139  K 4.0  CL 100  CO2 30  GLUCOSE 113*  BUN 10  CREATININE 0.63  CALCIUM 9.3    ABG: No results for input(s): PHART, PCO2ART, PO2ART, HCO3, O2SAT in the last 168 hours.  Liver Function Tests: No results for input(s): AST, ALT, ALKPHOS, BILITOT, PROT, ALBUMIN in the last 168 hours. No results for input(s): LIPASE, AMYLASE in the last 168 hours. No results for input(s): AMMONIA in the last 168 hours.  CBC: Recent Labs  Lab 01/21/20 0642  WBC 6.1  HGB 11.8*  HCT 36.8*  MCV 88.7  PLT 277    Cardiac Enzymes: No results for input(s): CKTOTAL, CKMB, CKMBINDEX, TROPONINI in the last 168 hours.  BNP (last 3 results) No results for input(s): BNP in the last 8760 hours.  ProBNP (last 3 results) No results for input(s): PROBNP in the last 8760 hours.  Radiological Exams: No results  found.  Assessment/Plan Active Problems:   Acute on chronic respiratory failure with hypoxia (HCC)   Facial trauma, sequela   Tracheostomy status (HCC)   Pulmonary atelectasis   1. Acute on chronic respiratory failure hypoxia we will continue with the capping process awaiting input for ENT referral regarding decannulation 2. Facial trauma still with the wired jaw 3. Tracheostomy remains in place 4. Pulmonary atelectasis doing well   I have personally seen and evaluated the patient, evaluated laboratory and imaging results, formulated the assessment and plan and placed orders. The Patient requires high complexity decision making with multiple systems involvement.  Rounds were done with the Respiratory Therapy Director and Staff therapists and discussed with nursing staff also.  Yevonne Pax, MD [facility] Pulmonary Critical Care Medicine Sleep Medicine

## 2020-01-27 DIAGNOSIS — J9811 Atelectasis: Secondary | ICD-10-CM | POA: Diagnosis not present

## 2020-01-27 DIAGNOSIS — J9621 Acute and chronic respiratory failure with hypoxia: Secondary | ICD-10-CM | POA: Diagnosis not present

## 2020-01-27 DIAGNOSIS — S0993XS Unspecified injury of face, sequela: Secondary | ICD-10-CM | POA: Diagnosis not present

## 2020-01-27 DIAGNOSIS — Z930 Tracheostomy status: Secondary | ICD-10-CM | POA: Diagnosis not present

## 2020-01-27 NOTE — Progress Notes (Addendum)
Pulmonary Critical Care Medicine [facility] [place]   PULMONARY CRITICAL CARE SERVICE  PROGRESS NOTE  Date of Service: 01/27/2020  Brian Farley  [id number]  DOB: 27-Dec-1965   DOA: 01/13/2020  Referring Physician: Carron Curie, MD  HPI: Brian Farley is a 54 y.o. male seen for follow up of Acute on Chronic Respiratory Failure. Patient remains on room air at this time satting well no distress.  Medications: Reviewed on Rounds  Physical Exam:  Vitals: Pulse 82 respirations 20 BP 110/56 O2 sat 94% temp 96.9  Ventilator Settings room air  . General: Comfortable at this time . Eyes: Grossly normal lids, irises & conjunctiva . ENT: grossly tongue is normal . Neck: no obvious mass . Cardiovascular: S1 S2 normal no gallop . Respiratory: No rales or rhonchi noted . Abdomen: soft . Skin: no rash seen on limited exam . Musculoskeletal: not rigid . Psychiatric:unable to assess . Neurologic: no seizure no involuntary movements         Lab Data:   Basic Metabolic Panel: Recent Labs  Lab 01/21/20 0642  NA 139  K 4.0  CL 100  CO2 30  GLUCOSE 113*  BUN 10  CREATININE 0.63  CALCIUM 9.3    ABG: No results for input(s): PHART, PCO2ART, PO2ART, HCO3, O2SAT in the last 168 hours.  Liver Function Tests: No results for input(s): AST, ALT, ALKPHOS, BILITOT, PROT, ALBUMIN in the last 168 hours. No results for input(s): LIPASE, AMYLASE in the last 168 hours. No results for input(s): AMMONIA in the last 168 hours.  CBC: Recent Labs  Lab 01/21/20 0642  WBC 6.1  HGB 11.8*  HCT 36.8*  MCV 88.7  PLT 277    Cardiac Enzymes: No results for input(s): CKTOTAL, CKMB, CKMBINDEX, TROPONINI in the last 168 hours.  BNP (last 3 results) No results for input(s): BNP in the last 8760 hours.  ProBNP (last 3 results) No results for input(s): PROBNP in the last 8760 hours.  Radiological Exams: No results found.  Assessment/Plan Active Problems:   Acute on chronic  respiratory failure with hypoxia (HCC)   Facial trauma, sequela   Tracheostomy status (HCC)   Pulmonary atelectasis   1. Acute on chronic respiratory failure hypoxia patient remains capped on room air this time satting well continue aggressive pulmonary toilet supportive measures. 2. Facial trauma still with the wired jaw 3. Tracheostomy remains in place 4. Pulmonary atelectasis doing well   I have personally seen and evaluated the patient, evaluated laboratory and imaging results, formulated the assessment and plan and placed orders. The Patient requires high complexity decision making with multiple systems involvement.  Rounds were done with the Respiratory Therapy Director and Staff therapists and discussed with nursing staff also.  Yevonne Pax, MD [facility] Pulmonary Critical Care Medicine Sleep Medicine

## 2020-01-28 DIAGNOSIS — J9811 Atelectasis: Secondary | ICD-10-CM | POA: Diagnosis not present

## 2020-01-28 DIAGNOSIS — Z930 Tracheostomy status: Secondary | ICD-10-CM | POA: Diagnosis not present

## 2020-01-28 DIAGNOSIS — J9621 Acute and chronic respiratory failure with hypoxia: Secondary | ICD-10-CM | POA: Diagnosis not present

## 2020-01-28 DIAGNOSIS — S0993XS Unspecified injury of face, sequela: Secondary | ICD-10-CM | POA: Diagnosis not present

## 2020-01-28 LAB — CBC
HCT: 35.4 % — ABNORMAL LOW (ref 39.0–52.0)
Hemoglobin: 11.5 g/dL — ABNORMAL LOW (ref 13.0–17.0)
MCH: 28.3 pg (ref 26.0–34.0)
MCHC: 32.5 g/dL (ref 30.0–36.0)
MCV: 87.2 fL (ref 80.0–100.0)
Platelets: 250 10*3/uL (ref 150–400)
RBC: 4.06 MIL/uL — ABNORMAL LOW (ref 4.22–5.81)
RDW: 13 % (ref 11.5–15.5)
WBC: 6.9 10*3/uL (ref 4.0–10.5)
nRBC: 0 % (ref 0.0–0.2)

## 2020-01-28 LAB — BASIC METABOLIC PANEL
Anion gap: 10 (ref 5–15)
BUN: 7 mg/dL (ref 6–20)
CO2: 27 mmol/L (ref 22–32)
Calcium: 9.2 mg/dL (ref 8.9–10.3)
Chloride: 102 mmol/L (ref 98–111)
Creatinine, Ser: 0.65 mg/dL (ref 0.61–1.24)
GFR calc Af Amer: >60 mL/min (ref >60)
GFR calc non Af Amer: >60 mL/min (ref >60)
Glucose, Bld: 98 mg/dL (ref 70–99)
Potassium: 4.3 mmol/L (ref 3.5–5.1)
Sodium: 139 mmol/L (ref 135–145)

## 2020-01-28 NOTE — Progress Notes (Signed)
Pulmonary Critical Care Medicine [facility] [place]   PULMONARY CRITICAL CARE SERVICE  PROGRESS NOTE  Date of Service: 01/28/2020  Brian Farley  KGY:[id number]  DOB: [birth year]   DOA: 01/13/2020  Referring Physician: Carron Curie, MD  HPI: Brian Farley is a 54 y.o. male seen for follow up of Acute on Chronic Respiratory Failure.  Patient is doing capping doing fairly well right now without distress  Medications: Reviewed on Rounds  Physical Exam:  Vitals: Temperature is 98.6 pulse 60 respiratory rate 20 blood pressure is 113/63 saturations 100%  Ventilator Settings capping off the ventilator  . General: Comfortable at this time . Eyes: Grossly normal lids, irises & conjunctiva . ENT: grossly tongue is normal . Neck: no obvious mass . Cardiovascular: S1 S2 normal no gallop . Respiratory: No rhonchi no rales are noted at this time . Abdomen: soft . Skin: no rash seen on limited exam . Musculoskeletal: not rigid . Psychiatric:unable to assess . Neurologic: no seizure no involuntary movements         Lab Data:   Basic Metabolic Panel: Recent Labs  Lab 01/28/20 0707  NA 139  K 4.3  CL 102  CO2 27  GLUCOSE 98  BUN 7  CREATININE 0.65  CALCIUM 9.2    ABG: No results for input(s): PHART, PCO2ART, PO2ART, HCO3, O2SAT in the last 168 hours.  Liver Function Tests: No results for input(s): AST, ALT, ALKPHOS, BILITOT, PROT, ALBUMIN in the last 168 hours. No results for input(s): LIPASE, AMYLASE in the last 168 hours. No results for input(s): AMMONIA in the last 168 hours.  CBC: Recent Labs  Lab 01/28/20 0707  WBC 6.9  HGB 11.5*  HCT 35.4*  MCV 87.2  PLT 250    Cardiac Enzymes: No results for input(s): CKTOTAL, CKMB, CKMBINDEX, TROPONINI in the last 168 hours.  BNP (last 3 results) No results for input(s): BNP in the last 8760 hours.  ProBNP (last 3 results) No results for input(s): PROBNP in the last 8760 hours.  Radiological Exams: No  results found.  Assessment/Plan Active Problems:   Acute on chronic respiratory failure with hypoxia (HCC)   Facial trauma, sequela   Tracheostomy status (HCC)   Pulmonary atelectasis   1. Acute on chronic respiratory failure hypoxia we will continue with capping trials await discharge planning 2. Facial trauma no change continue with supportive care 3. Tracheostomy remains in place 4. Pulmonary atelectasis at baseline right now   I have personally seen and evaluated the patient, evaluated laboratory and imaging results, formulated the assessment and plan and placed orders. The Patient requires high complexity decision making with multiple systems involvement.  Rounds were done with the Respiratory Therapy Director and Staff therapists and discussed with nursing staff also.  Yevonne Pax, MD [facility] Pulmonary Critical Care Medicine Sleep Medicine

## 2020-01-29 DIAGNOSIS — S0993XS Unspecified injury of face, sequela: Secondary | ICD-10-CM | POA: Diagnosis not present

## 2020-01-29 DIAGNOSIS — J9811 Atelectasis: Secondary | ICD-10-CM | POA: Diagnosis not present

## 2020-01-29 DIAGNOSIS — Z930 Tracheostomy status: Secondary | ICD-10-CM | POA: Diagnosis not present

## 2020-01-29 DIAGNOSIS — J9621 Acute and chronic respiratory failure with hypoxia: Secondary | ICD-10-CM | POA: Diagnosis not present

## 2020-01-29 NOTE — Progress Notes (Signed)
Pulmonary Critical Care Medicine [facility] [place]   PULMONARY CRITICAL CARE SERVICE  PROGRESS NOTE  Date of Service: 01/29/2020  Brian Farley  LXB:[id number]  DOB: February 05, 1966   DOA: 01/13/2020  Referring Physician: Carron Curie, MD  HPI: Brian Farley is a 54 y.o. male seen for follow up of Acute on Chronic Respiratory Failure.  Patient is capping currently on room air appears to be comfortable without distress  Medications: Reviewed on Rounds  Physical Exam:  Vitals: Temperature is 99.2 pulse 86 respiratory 16 blood pressure is 123/71 saturations 97%  Ventilator Settings capping on room air  . General: Comfortable at this time . Eyes: Grossly normal lids, irises & conjunctiva . ENT: grossly tongue is normal . Neck: no obvious mass . Cardiovascular: S1 S2 normal no gallop . Respiratory: No rhonchi no rales are noted at this time . Abdomen: soft . Skin: no rash seen on limited exam . Musculoskeletal: not rigid . Psychiatric:unable to assess . Neurologic: no seizure no involuntary movements         Lab Data:   Basic Metabolic Panel: Recent Labs  Lab 01/28/20 0707  NA 139  K 4.3  CL 102  CO2 27  GLUCOSE 98  BUN 7  CREATININE 0.65  CALCIUM 9.2    ABG: No results for input(s): PHART, PCO2ART, PO2ART, HCO3, O2SAT in the last 168 hours.  Liver Function Tests: No results for input(s): AST, ALT, ALKPHOS, BILITOT, PROT, ALBUMIN in the last 168 hours. No results for input(s): LIPASE, AMYLASE in the last 168 hours. No results for input(s): AMMONIA in the last 168 hours.  CBC: Recent Labs  Lab 01/28/20 0707  WBC 6.9  HGB 11.5*  HCT 35.4*  MCV 87.2  PLT 250    Cardiac Enzymes: No results for input(s): CKTOTAL, CKMB, CKMBINDEX, TROPONINI in the last 168 hours.  BNP (last 3 results) No results for input(s): BNP in the last 8760 hours.  ProBNP (last 3 results) No results for input(s): PROBNP in the last 8760 hours.  Radiological Exams: No  results found.  Assessment/Plan Active Problems:   Acute on chronic respiratory failure with hypoxia (HCC)   Facial trauma, sequela   Tracheostomy status (HCC)   Pulmonary atelectasis   1. Acute on chronic respiratory failure hypoxia patient right now is capping on room air doing fine at baseline 2. Facial trauma supportive care 3. Tracheostomy will remain in place 4. Pulmonary atelectasis no change continue present management   I have personally seen and evaluated the patient, evaluated laboratory and imaging results, formulated the assessment and plan and placed orders. The Patient requires high complexity decision making with multiple systems involvement.  Rounds were done with the Respiratory Therapy Director and Staff therapists and discussed with nursing staff also.  Yevonne Pax, MD [facility] Pulmonary Critical Care Medicine Sleep Medicine

## 2020-01-30 DIAGNOSIS — J9621 Acute and chronic respiratory failure with hypoxia: Secondary | ICD-10-CM | POA: Diagnosis not present

## 2020-01-30 DIAGNOSIS — J9811 Atelectasis: Secondary | ICD-10-CM | POA: Diagnosis not present

## 2020-01-30 DIAGNOSIS — S0993XS Unspecified injury of face, sequela: Secondary | ICD-10-CM | POA: Diagnosis not present

## 2020-01-30 DIAGNOSIS — Z930 Tracheostomy status: Secondary | ICD-10-CM | POA: Diagnosis not present

## 2020-01-30 NOTE — Progress Notes (Signed)
Pulmonary Critical Care Medicine [facility] [place]   PULMONARY CRITICAL CARE SERVICE  PROGRESS NOTE  Date of Service: 01/30/2020  Brian Farley  [id number]:[id number]  DOB: Aug 22, 1965   DOA: 01/13/2020  Referring Physician: Carron Curie, MD  HPI: Brian Farley is a 54 y.o. male seen for follow up of Acute on Chronic Respiratory Failure.  Patient is capping right now appears to be comfortable without distress at this time.  Medications: Reviewed on Rounds  Physical Exam:  Vitals: Temperature is 98.0 pulse 69 respiratory 18 blood pressure is 110/61 saturations 95%  Ventilator Settings capping currently room air  . General: Comfortable at this time . Eyes: Grossly normal lids, irises & conjunctiva . ENT: grossly tongue is normal . Neck: no obvious mass . Cardiovascular: S1 S2 normal no gallop . Respiratory: No rhonchi no rales are noted at this time . Abdomen: soft . Skin: no rash seen on limited exam . Musculoskeletal: not rigid . Psychiatric:unable to assess . Neurologic: no seizure no involuntary movements         Lab Data:   Basic Metabolic Panel: Recent Labs  Lab 01/28/20 0707  NA 139  K 4.3  CL 102  CO2 27  GLUCOSE 98  BUN 7  CREATININE 0.65  CALCIUM 9.2    ABG: No results for input(s): PHART, PCO2ART, PO2ART, HCO3, O2SAT in the last 168 hours.  Liver Function Tests: No results for input(s): AST, ALT, ALKPHOS, BILITOT, PROT, ALBUMIN in the last 168 hours. No results for input(s): LIPASE, AMYLASE in the last 168 hours. No results for input(s): AMMONIA in the last 168 hours.  CBC: Recent Labs  Lab 01/28/20 0707  WBC 6.9  HGB 11.5*  HCT 35.4*  MCV 87.2  PLT 250    Cardiac Enzymes: No results for input(s): CKTOTAL, CKMB, CKMBINDEX, TROPONINI in the last 168 hours.  BNP (last 3 results) No results for input(s): BNP in the last 8760 hours.  ProBNP (last 3 results) No results for input(s): PROBNP in the last 8760 hours.  Radiological  Exams: No results found.  Assessment/Plan Active Problems:   Acute on chronic respiratory failure with hypoxia (HCC)   Facial trauma, sequela   Tracheostomy status (HCC)   Pulmonary atelectasis   1. Acute on chronic respiratory failure hypoxia continues to do fine with capping which will be continued 2. Facial trauma no change continue supportive care 3. Pulmonary atelectasis improved 4. Tracheostomy remains in place   I have personally seen and evaluated the patient, evaluated laboratory and imaging results, formulated the assessment and plan and placed orders. The Patient requires high complexity decision making with multiple systems involvement.  Rounds were done with the Respiratory Therapy Director and Staff therapists and discussed with nursing staff also.  Yevonne Pax, MD [facility] Pulmonary Critical Care Medicine Sleep Medicine

## 2020-01-31 DIAGNOSIS — S0993XS Unspecified injury of face, sequela: Secondary | ICD-10-CM | POA: Diagnosis not present

## 2020-01-31 DIAGNOSIS — Z930 Tracheostomy status: Secondary | ICD-10-CM | POA: Diagnosis not present

## 2020-01-31 DIAGNOSIS — J9811 Atelectasis: Secondary | ICD-10-CM | POA: Diagnosis not present

## 2020-01-31 DIAGNOSIS — J9621 Acute and chronic respiratory failure with hypoxia: Secondary | ICD-10-CM | POA: Diagnosis not present

## 2020-01-31 NOTE — Progress Notes (Signed)
Pulmonary Critical Care Medicine [facility] [place]   PULMONARY CRITICAL CARE SERVICE  PROGRESS NOTE  Date of Service: 01/31/2020  Brian Farley  [id number]  DOB: [birth year]   DOA: 01/13/2020  Referring Physician: Carron Curie, MD  HPI: Brian Farley is a 54 y.o. male seen for follow up of Acute on Chronic Respiratory Failure.  Patient at this time has capping has been on room air good saturations are noted.  Secretions are minimal.  Medications: Reviewed on Rounds  Physical Exam:  Vitals: Temperature is 96.7 pulse 71 respiratory rate 20 blood pressure is 105/57 saturations 96%  Ventilator Settings capping on room air  . General: Comfortable at this time . Eyes: Grossly normal lids, irises & conjunctiva . ENT: grossly tongue is normal . Neck: no obvious mass . Cardiovascular: S1 S2 normal no gallop . Respiratory: No rhonchi no rales are noted at this time . Abdomen: soft . Skin: no rash seen on limited exam . Musculoskeletal: not rigid . Psychiatric:unable to assess . Neurologic: no seizure no involuntary movements         Lab Data:   Basic Metabolic Panel: Recent Labs  Lab 01/28/20 0707  NA 139  K 4.3  CL 102  CO2 27  GLUCOSE 98  BUN 7  CREATININE 0.65  CALCIUM 9.2    ABG: No results for input(s): PHART, PCO2ART, PO2ART, HCO3, O2SAT in the last 168 hours.  Liver Function Tests: No results for input(s): AST, ALT, ALKPHOS, BILITOT, PROT, ALBUMIN in the last 168 hours. No results for input(s): LIPASE, AMYLASE in the last 168 hours. No results for input(s): AMMONIA in the last 168 hours.  CBC: Recent Labs  Lab 01/28/20 0707  WBC 6.9  HGB 11.5*  HCT 35.4*  MCV 87.2  PLT 250    Cardiac Enzymes: No results for input(s): CKTOTAL, CKMB, CKMBINDEX, TROPONINI in the last 168 hours.  BNP (last 3 results) No results for input(s): BNP in the last 8760 hours.  ProBNP (last 3 results) No results for input(s): PROBNP in the last 8760  hours.  Radiological Exams: No results found.  Assessment/Plan Active Problems:   Acute on chronic respiratory failure with hypoxia (HCC)   Facial trauma, sequela   Tracheostomy status (HCC)   Pulmonary atelectasis   1. Acute on chronic respiratory failure hypoxia doing fine with capping plan is to continue with capping trials 2. Facial trauma no change we will continue with supportive care 3. Tracheostomy remains in place 4. Pulmonary atelectasis continue aggressive pulmonary toilet   I have personally seen and evaluated the patient, evaluated laboratory and imaging results, formulated the assessment and plan and placed orders. The Patient requires high complexity decision making with multiple systems involvement.  Rounds were done with the Respiratory Therapy Director and Staff therapists and discussed with nursing staff also.  Yevonne Pax, MD [facility] Pulmonary Critical Care Medicine Sleep Medicine

## 2020-02-01 DIAGNOSIS — J9811 Atelectasis: Secondary | ICD-10-CM | POA: Diagnosis not present

## 2020-02-01 DIAGNOSIS — Z930 Tracheostomy status: Secondary | ICD-10-CM | POA: Diagnosis not present

## 2020-02-01 DIAGNOSIS — J9621 Acute and chronic respiratory failure with hypoxia: Secondary | ICD-10-CM | POA: Diagnosis not present

## 2020-02-01 DIAGNOSIS — S0993XS Unspecified injury of face, sequela: Secondary | ICD-10-CM | POA: Diagnosis not present

## 2020-02-01 NOTE — Progress Notes (Addendum)
Pulmonary Critical Care Medicine [facility] [place]   PULMONARY CRITICAL CARE SERVICE  PROGRESS NOTE  Date of Service: 02/01/2020  Brian Farley  [id number]  DOB: 05-Apr-1966   DOA: 01/13/2020  Referring Physician: Carron Curie, MD  HPI: Brian Farley is a 54 y.o. male seen for follow up of Acute on Chronic Respiratory Failure. Patient mains capped on room air at this time at baseline satting well no distress.  Medications: Reviewed on Rounds  Physical Exam:  Vitals: Pulse 77 respiration 20 BP 108/59 O2 sat 98% temp 97.2  Ventilator Settings room air  . General: Comfortable at this time . Eyes: Grossly normal lids, irises & conjunctiva . ENT: grossly tongue is normal . Neck: no obvious mass . Cardiovascular: S1 S2 normal no gallop . Respiratory: No rales or rhonchi noted . Abdomen: soft . Skin: no rash seen on limited exam . Musculoskeletal: not rigid . Psychiatric:unable to assess . Neurologic: no seizure no involuntary movements         Lab Data:   Basic Metabolic Panel: Recent Labs  Lab 01/28/20 0707  NA 139  K 4.3  CL 102  CO2 27  GLUCOSE 98  BUN 7  CREATININE 0.65  CALCIUM 9.2    ABG: No results for input(s): PHART, PCO2ART, PO2ART, HCO3, O2SAT in the last 168 hours.  Liver Function Tests: No results for input(s): AST, ALT, ALKPHOS, BILITOT, PROT, ALBUMIN in the last 168 hours. No results for input(s): LIPASE, AMYLASE in the last 168 hours. No results for input(s): AMMONIA in the last 168 hours.  CBC: Recent Labs  Lab 01/28/20 0707  WBC 6.9  HGB 11.5*  HCT 35.4*  MCV 87.2  PLT 250    Cardiac Enzymes: No results for input(s): CKTOTAL, CKMB, CKMBINDEX, TROPONINI in the last 168 hours.  BNP (last 3 results) No results for input(s): BNP in the last 8760 hours.  ProBNP (last 3 results) No results for input(s): PROBNP in the last 8760 hours.  Radiological Exams: No results found.  Assessment/Plan Active Problems:    Acute on chronic respiratory failure with hypoxia (HCC)   Facial trauma, sequela   Tracheostomy status (HCC)   Pulmonary atelectasis   1. Acute on chronic respiratory failure hypoxia doing fine with capping plan is to continue with capping trials 2. Facial trauma no change we will continue with supportive care 3. Tracheostomy remains in place 4. Pulmonary atelectasis continue aggressive pulmonary toilet   I have personally seen and evaluated the patient, evaluated laboratory and imaging results, formulated the assessment and plan and placed orders. The Patient requires high complexity decision making with multiple systems involvement.  Rounds were done with the Respiratory Therapy Director and Staff therapists and discussed with nursing staff also.  Yevonne Pax, MD [facility] Pulmonary Critical Care Medicine Sleep Medicine

## 2020-02-02 DIAGNOSIS — S0993XS Unspecified injury of face, sequela: Secondary | ICD-10-CM | POA: Diagnosis not present

## 2020-02-02 DIAGNOSIS — J9621 Acute and chronic respiratory failure with hypoxia: Secondary | ICD-10-CM | POA: Diagnosis not present

## 2020-02-02 DIAGNOSIS — Z930 Tracheostomy status: Secondary | ICD-10-CM | POA: Diagnosis not present

## 2020-02-02 DIAGNOSIS — J9811 Atelectasis: Secondary | ICD-10-CM | POA: Diagnosis not present

## 2020-02-02 NOTE — Progress Notes (Addendum)
Pulmonary Critical Care Medicine [facility] [place]   PULMONARY CRITICAL CARE SERVICE  PROGRESS NOTE  Date of Service: 02/02/2020  Rodderick Holtzer  [id number]  DOB: 07-26-1966   DOA: 01/13/2020  Referring Physician: Carron Curie, MD  HPI: [name] is a 54 y.o. male seen for follow up of Acute on Chronic Respiratory Failure.  Patient remains capped on room air at this time satting well no distress.  Medications: Reviewed on Rounds  Physical Exam:  Vitals: Pulse 73 respirations 22 BP 112/60 O2 sat 97% temp 96.8  Ventilator Settings room air  . General: Comfortable at this time . Eyes: Grossly normal lids, irises & conjunctiva . ENT: grossly tongue is normal . Neck: no obvious mass . Cardiovascular: S1 S2 normal no gallop . Respiratory: No rales or rhonchi noted . Abdomen: soft . Skin: no rash seen on limited exam . Musculoskeletal: not rigid . Psychiatric:unable to assess . Neurologic: no seizure no involuntary movements         Lab Data:   Basic Metabolic Panel: Recent Labs  Lab 01/28/20 0707  NA 139  K 4.3  CL 102  CO2 27  GLUCOSE 98  BUN 7  CREATININE 0.65  CALCIUM 9.2    ABG: No results for input(s): PHART, PCO2ART, PO2ART, HCO3, O2SAT in the last 168 hours.  Liver Function Tests: No results for input(s): AST, ALT, ALKPHOS, BILITOT, PROT, ALBUMIN in the last 168 hours. No results for input(s): LIPASE, AMYLASE in the last 168 hours. No results for input(s): AMMONIA in the last 168 hours.  CBC: Recent Labs  Lab 01/28/20 0707  WBC 6.9  HGB 11.5*  HCT 35.4*  MCV 87.2  PLT 250    Cardiac Enzymes: No results for input(s): CKTOTAL, CKMB, CKMBINDEX, TROPONINI in the last 168 hours.  BNP (last 3 results) No results for input(s): BNP in the last 8760 hours.  ProBNP (last 3 results) No results for input(s): PROBNP in the last 8760 hours.  Radiological Exams: No results found.  Assessment/Plan Active Problems:   Acute on  chronic respiratory failure with hypoxia (HCC)   Facial trauma, sequela   Tracheostomy status (HCC)   Pulmonary atelectasis   1. Acute on chronic respiratory failure hypoxia doing fine with capping plan is to continue with capping trials 2. Facial trauma no change we will continue with supportive care 3. Tracheostomy remains in place 4. Pulmonary atelectasis continue aggressive pulmonary toilet   I have personally seen and evaluated the patient, evaluated laboratory and imaging results, formulated the assessment and plan and placed orders. The Patient requires high complexity decision making with multiple systems involvement.  Rounds were done with the Respiratory Therapy Director and Staff therapists and discussed with nursing staff also.  Yevonne Pax, MD [facility] Pulmonary Critical Care Medicine Sleep Medicine

## 2020-02-03 DIAGNOSIS — S0993XS Unspecified injury of face, sequela: Secondary | ICD-10-CM | POA: Diagnosis not present

## 2020-02-03 DIAGNOSIS — Z930 Tracheostomy status: Secondary | ICD-10-CM | POA: Diagnosis not present

## 2020-02-03 DIAGNOSIS — J9811 Atelectasis: Secondary | ICD-10-CM | POA: Diagnosis not present

## 2020-02-03 DIAGNOSIS — J9621 Acute and chronic respiratory failure with hypoxia: Secondary | ICD-10-CM | POA: Diagnosis not present

## 2020-02-03 NOTE — Progress Notes (Addendum)
Pulmonary Critical Care Medicine [facility] [place]   PULMONARY CRITICAL CARE SERVICE  PROGRESS NOTE  Date of Service: 02/03/2020  Brian Farley  [id number]  DOB: 02/02/1966   DOA: 01/13/2020  Referring Physician: Carron Curie, MD  HPI: Brian Farley is a 54 y.o. male seen for follow up of Acute on Chronic Respiratory Failure.  Patient remains capped on room air at this time satting well with no fever distress.  Medications: Reviewed on Rounds  Physical Exam:  Vitals: Pulse 74 respirations 16 BP 127/62 O2 sat 100% temp 96.2  Ventilator Settings room air  . General: Comfortable at this time . Eyes: Grossly normal lids, irises & conjunctiva . ENT: grossly tongue is normal . Neck: no obvious mass . Cardiovascular: S1 S2 normal no gallop . Respiratory: No rales or rhonchi noted . Abdomen: soft . Skin: no rash seen on limited exam . Musculoskeletal: not rigid . Psychiatric:unable to assess . Neurologic: no seizure no involuntary movements         Lab Data:   Basic Metabolic Panel: Recent Labs  Lab 01/28/20 0707  NA 139  K 4.3  CL 102  CO2 27  GLUCOSE 98  BUN 7  CREATININE 0.65  CALCIUM 9.2    ABG: No results for input(s): PHART, PCO2ART, PO2ART, HCO3, O2SAT in the last 168 hours.  Liver Function Tests: No results for input(s): AST, ALT, ALKPHOS, BILITOT, PROT, ALBUMIN in the last 168 hours. No results for input(s): LIPASE, AMYLASE in the last 168 hours. No results for input(s): AMMONIA in the last 168 hours.  CBC: Recent Labs  Lab 01/28/20 0707  WBC 6.9  HGB 11.5*  HCT 35.4*  MCV 87.2  PLT 250    Cardiac Enzymes: No results for input(s): CKTOTAL, CKMB, CKMBINDEX, TROPONINI in the last 168 hours.  BNP (last 3 results) No results for input(s): BNP in the last 8760 hours.  ProBNP (last 3 results) No results for input(s): PROBNP in the last 8760 hours.  Radiological Exams: No results found.  Assessment/Plan Active Problems:    Acute on chronic respiratory failure with hypoxia (HCC)   Facial trauma, sequela   Tracheostomy status (HCC)   Pulmonary atelectasis   1. Acute on chronic respiratory failure hypoxia doing fine with capping plan is to continue with capping trials 2. Facial trauma no change we will continue with supportive care 3. Tracheostomy remains in place 4. Pulmonary atelectasis continue aggressive pulmonary toilet   I have personally seen and evaluated the patient, evaluated laboratory and imaging results, formulated the assessment and plan and placed orders. The Patient requires high complexity decision making with multiple systems involvement.  Rounds were done with the Respiratory Therapy Director and Staff therapists and discussed with nursing staff also.  Yevonne Pax, MD [facility] Pulmonary Critical Care Medicine Sleep Medicine

## 2020-02-04 DIAGNOSIS — S0993XS Unspecified injury of face, sequela: Secondary | ICD-10-CM | POA: Diagnosis not present

## 2020-02-04 DIAGNOSIS — J9811 Atelectasis: Secondary | ICD-10-CM | POA: Diagnosis not present

## 2020-02-04 DIAGNOSIS — Z930 Tracheostomy status: Secondary | ICD-10-CM | POA: Diagnosis not present

## 2020-02-04 DIAGNOSIS — J9621 Acute and chronic respiratory failure with hypoxia: Secondary | ICD-10-CM | POA: Diagnosis not present

## 2020-02-04 LAB — CBC
HCT: 36.1 % — ABNORMAL LOW (ref 39.0–52.0)
Hemoglobin: 11.6 g/dL — ABNORMAL LOW (ref 13.0–17.0)
MCH: 28 pg (ref 26.0–34.0)
MCHC: 32.1 g/dL (ref 30.0–36.0)
MCV: 87 fL (ref 80.0–100.0)
Platelets: 241 10*3/uL (ref 150–400)
RBC: 4.15 MIL/uL — ABNORMAL LOW (ref 4.22–5.81)
RDW: 13 % (ref 11.5–15.5)
WBC: 5.8 10*3/uL (ref 4.0–10.5)
nRBC: 0 % (ref 0.0–0.2)

## 2020-02-04 LAB — BASIC METABOLIC PANEL
Anion gap: 8 (ref 5–15)
BUN: 13 mg/dL (ref 6–20)
CO2: 28 mmol/L (ref 22–32)
Calcium: 9.1 mg/dL (ref 8.9–10.3)
Chloride: 102 mmol/L (ref 98–111)
Creatinine, Ser: 0.6 mg/dL — ABNORMAL LOW (ref 0.61–1.24)
GFR calc Af Amer: >60 mL/min (ref >60)
GFR calc non Af Amer: >60 mL/min (ref >60)
Glucose, Bld: 101 mg/dL — ABNORMAL HIGH (ref 70–99)
Potassium: 4 mmol/L (ref 3.5–5.1)
Sodium: 138 mmol/L (ref 135–145)

## 2020-02-04 NOTE — Progress Notes (Signed)
Pulmonary Critical Care Medicine [facility] [place]   PULMONARY CRITICAL CARE SERVICE  PROGRESS NOTE  Date of Service: 02/04/2020  Brian Farley  [id number]  DOB: 08/17/1966   DOA: 01/13/2020  Referring Physician: Carron Curie, MD  HPI: Brian Farley is a 54 y.o. male seen for follow up of Acute on Chronic Respiratory Failure.  Patient currently is doing well with capping trials has been on room air without any distress at this time.  Medications: Reviewed on Rounds  Physical Exam:  Vitals: Temperature 97.4 pulse 72 respiratory rate 16 blood pressure is 134/76 saturations 97%  Ventilator Settings capping off the ventilator on room air  . General: Comfortable at this time . Eyes: Grossly normal lids, irises & conjunctiva . ENT: grossly tongue is normal . Neck: no obvious mass . Cardiovascular: S1 S2 normal no gallop . Respiratory: No rhonchi coarse breath sounds . Abdomen: soft . Skin: no rash seen on limited exam . Musculoskeletal: not rigid . Psychiatric:unable to assess . Neurologic: no seizure no involuntary movements         Lab Data:   Basic Metabolic Panel: Recent Labs  Lab 02/04/20 0707  NA 138  K 4.0  CL 102  CO2 28  GLUCOSE 101*  BUN 13  CREATININE 0.60*  CALCIUM 9.1    ABG: No results for input(s): PHART, PCO2ART, PO2ART, HCO3, O2SAT in the last 168 hours.  Liver Function Tests: No results for input(s): AST, ALT, ALKPHOS, BILITOT, PROT, ALBUMIN in the last 168 hours. No results for input(s): LIPASE, AMYLASE in the last 168 hours. No results for input(s): AMMONIA in the last 168 hours.  CBC: Recent Labs  Lab 02/04/20 0707  WBC 5.8  HGB 11.6*  HCT 36.1*  MCV 87.0  PLT 241    Cardiac Enzymes: No results for input(s): CKTOTAL, CKMB, CKMBINDEX, TROPONINI in the last 168 hours.  BNP (last 3 results) No results for input(s): BNP in the last 8760 hours.  ProBNP (last 3 results) No results for input(s): PROBNP in the last  8760 hours.  Radiological Exams: No results found.  Assessment/Plan Active Problems:   Acute on chronic respiratory failure with hypoxia (HCC)   Facial trauma, sequela   Tracheostomy status (HCC)   Pulmonary atelectasis   1. Acute on chronic respiratory failure with hypoxia plan is to continue with the capping as ordered 2. Facial trauma supportive care 3. Tracheostomy remains in place 4. Pulmonary atelectasis patient is at baseline we will continue to monitor   I have personally seen and evaluated the patient, evaluated laboratory and imaging results, formulated the assessment and plan and placed orders. The Patient requires high complexity decision making with multiple systems involvement.  Rounds were done with the Respiratory Therapy Director and Staff therapists and discussed with nursing staff also.  Yevonne Pax, MD [facility] Pulmonary Critical Care Medicine Sleep Medicine

## 2020-02-05 DIAGNOSIS — S0993XS Unspecified injury of face, sequela: Secondary | ICD-10-CM | POA: Diagnosis not present

## 2020-02-05 DIAGNOSIS — Z930 Tracheostomy status: Secondary | ICD-10-CM | POA: Diagnosis not present

## 2020-02-05 DIAGNOSIS — J9811 Atelectasis: Secondary | ICD-10-CM | POA: Diagnosis not present

## 2020-02-05 DIAGNOSIS — J9621 Acute and chronic respiratory failure with hypoxia: Secondary | ICD-10-CM | POA: Diagnosis not present

## 2020-02-05 NOTE — Progress Notes (Signed)
Pulmonary Critical Care Medicine [facility] [place]   PULMONARY CRITICAL CARE SERVICE  PROGRESS NOTE  Date of Service: 02/05/2020  Brian Farley  [id number]  DOB: 1966-07-19   DOA: 01/13/2020  Referring Physician: Carron Curie, MD  HPI: Brian Farley is a 54 y.o. male seen for follow up of Acute on Chronic Respiratory Failure.  Continues to do fine with capping currently is on room air  Medications: Reviewed on Rounds  Physical Exam:  Vitals: Temperature is 96.8 pulse 84 respiratory rate 20 blood pressure is 106/67 saturations 97%  Ventilator Settings capping on room air  . General: Comfortable at this time . Eyes: Grossly normal lids, irises & conjunctiva . ENT: grossly tongue is normal . Neck: no obvious mass . Cardiovascular: S1 S2 normal no gallop . Respiratory: No rhonchi no rales are noted at this time . Abdomen: soft . Skin: no rash seen on limited exam . Musculoskeletal: not rigid . Psychiatric:unable to assess . Neurologic: no seizure no involuntary movements         Lab Data:   Basic Metabolic Panel: Recent Labs  Lab 02/04/20 0707  NA 138  K 4.0  CL 102  CO2 28  GLUCOSE 101*  BUN 13  CREATININE 0.60*  CALCIUM 9.1    ABG: No results for input(s): PHART, PCO2ART, PO2ART, HCO3, O2SAT in the last 168 hours.  Liver Function Tests: No results for input(s): AST, ALT, ALKPHOS, BILITOT, PROT, ALBUMIN in the last 168 hours. No results for input(s): LIPASE, AMYLASE in the last 168 hours. No results for input(s): AMMONIA in the last 168 hours.  CBC: Recent Labs  Lab 02/04/20 0707  WBC 5.8  HGB 11.6*  HCT 36.1*  MCV 87.0  PLT 241    Cardiac Enzymes: No results for input(s): CKTOTAL, CKMB, CKMBINDEX, TROPONINI in the last 168 hours.  BNP (last 3 results) No results for input(s): BNP in the last 8760 hours.  ProBNP (last 3 results) No results for input(s): PROBNP in the last 8760 hours.  Radiological Exams: No results  found.  Assessment/Plan Active Problems:   Acute on chronic respiratory failure with hypoxia (HCC)   Facial trauma, sequela   Tracheostomy status (HCC)   Pulmonary atelectasis   1. Acute on chronic respiratory failure hypoxia we will continue with capping trials as ordered titrate oxygen as necessary right now is on room air 2. Facial trauma no change continue with supportive care 3. Tracheostomy remains in place 4. Pulmonary atelectasis improving continue secretion management   I have personally seen and evaluated the patient, evaluated laboratory and imaging results, formulated the assessment and plan and placed orders. The Patient requires high complexity decision making with multiple systems involvement.  Rounds were done with the Respiratory Therapy Director and Staff therapists and discussed with nursing staff also.  Yevonne Pax, MD [facility] Pulmonary Critical Care Medicine Sleep Medicine

## 2020-02-06 DIAGNOSIS — S0993XS Unspecified injury of face, sequela: Secondary | ICD-10-CM | POA: Diagnosis not present

## 2020-02-06 DIAGNOSIS — J9811 Atelectasis: Secondary | ICD-10-CM | POA: Diagnosis not present

## 2020-02-06 DIAGNOSIS — Z930 Tracheostomy status: Secondary | ICD-10-CM | POA: Diagnosis not present

## 2020-02-06 DIAGNOSIS — J9621 Acute and chronic respiratory failure with hypoxia: Secondary | ICD-10-CM | POA: Diagnosis not present

## 2020-02-06 LAB — SARS CORONAVIRUS 2 (TAT 6-24 HRS): SARS Coronavirus 2: NEGATIVE

## 2020-02-06 NOTE — Progress Notes (Signed)
Pulmonary Critical Care Medicine [facility] [place]   PULMONARY CRITICAL CARE SERVICE  PROGRESS NOTE  Date of Service: 02/06/2020  [name]  [id number]  DOB: 1966-02-14   DOA: 01/13/2020  Referring Physician: Carron Curie, MD  HPI: Brian Farley is a 54 y.o. male seen for follow up of Acute on Chronic Respiratory Failure.  Patient is capping currently is on room air without any distress  Medications: Reviewed on Rounds  Physical Exam:  Vitals: Temperature is 97.4 pulse 72 respiratory 20 blood pressure is 122/67 saturations 95%  Ventilator Settings capping on room air  . General: Comfortable at this time . Eyes: Grossly normal lids, irises & conjunctiva . ENT: grossly tongue is normal . Neck: no obvious mass . Cardiovascular: S1 S2 normal no gallop . Respiratory: No rhonchi coarse breath sounds . Abdomen: soft . Skin: no rash seen on limited exam . Musculoskeletal: not rigid . Psychiatric:unable to assess . Neurologic: no seizure no involuntary movements         Lab Data:   Basic Metabolic Panel: Recent Labs  Lab 02/04/20 0707  NA 138  K 4.0  CL 102  CO2 28  GLUCOSE 101*  BUN 13  CREATININE 0.60*  CALCIUM 9.1    ABG: No results for input(s): PHART, PCO2ART, PO2ART, HCO3, O2SAT in the last 168 hours.  Liver Function Tests: No results for input(s): AST, ALT, ALKPHOS, BILITOT, PROT, ALBUMIN in the last 168 hours. No results for input(s): LIPASE, AMYLASE in the last 168 hours. No results for input(s): AMMONIA in the last 168 hours.  CBC: Recent Labs  Lab 02/04/20 0707  WBC 5.8  HGB 11.6*  HCT 36.1*  MCV 87.0  PLT 241    Cardiac Enzymes: No results for input(s): CKTOTAL, CKMB, CKMBINDEX, TROPONINI in the last 168 hours.  BNP (last 3 results) No results for input(s): BNP in the last 8760 hours.  ProBNP (last 3 results) No results for input(s): PROBNP in the last 8760 hours.  Radiological Exams: No results  found.  Assessment/Plan Active Problems:   Acute on chronic respiratory failure with hypoxia (HCC)   Facial trauma, sequela   Tracheostomy status (HCC)   Pulmonary atelectasis   1. Acute on chronic respiratory failure hypoxia we will continue with capping trials as ordered titrate oxygen continue pulmonary toilet 2. Facial trauma no change continue supportive care 3. Tracheostomy remains in place 4. Pulmonary atelectasis at baseline we will continue with pulmonary toilet   I have personally seen and evaluated the patient, evaluated laboratory and imaging results, formulated the assessment and plan and placed orders. The Patient requires high complexity decision making with multiple systems involvement.  Rounds were done with the Respiratory Therapy Director and Staff therapists and discussed with nursing staff also.  Yevonne Pax, MD [facility] Pulmonary Critical Care Medicine Sleep Medicine

## 2021-03-03 IMAGING — CT CT ORBITS W/O CM
3 series · 10 of 47 positions shown, 11 images · non-contrast
Comparison: None.

CLINICAL DATA: Left orbital damage.  Acute inflammation left orbit.

EXAM:
CT ORBITS WITHOUT CONTRAST
TECHNIQUE: Multidetector CT images were obtained using the standard protocol
without intravenous contrast.

[Series 3: facial/ orbits 2.0 h30s · axial · 0.37mm/px · z∈[-150,-92]mm · 4 of 41 slices shown, 5 images]
[im 6/41  brain]
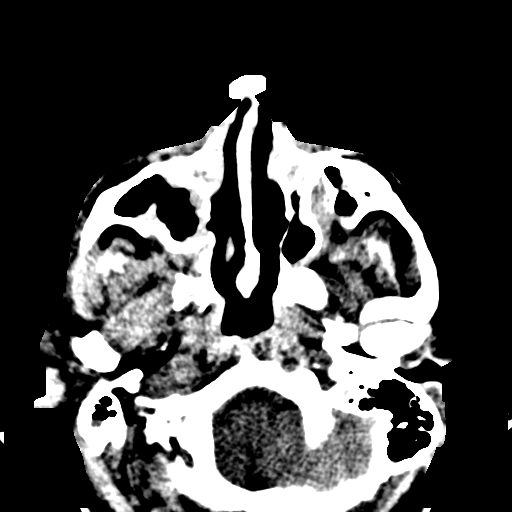
[im 6/41  bone]
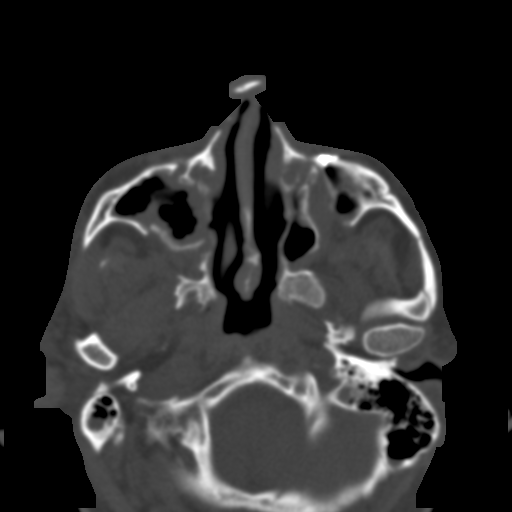
[im 16/41  bone]
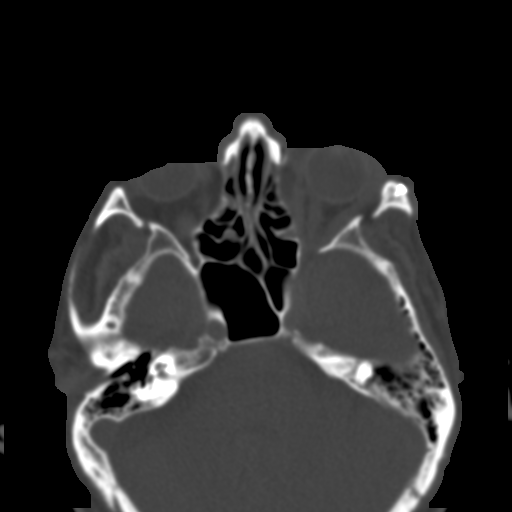
[im 25/41  bone]
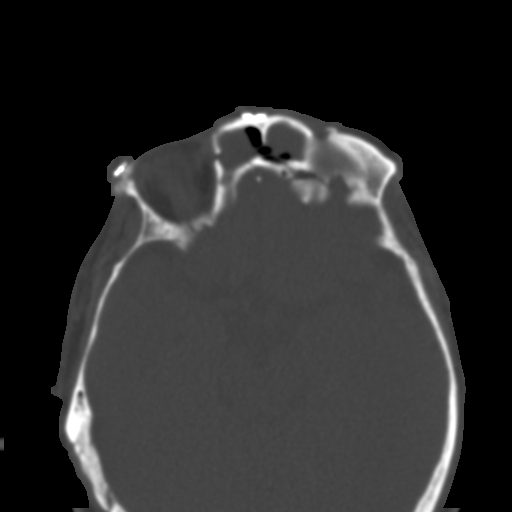
[im 35/41  bone]
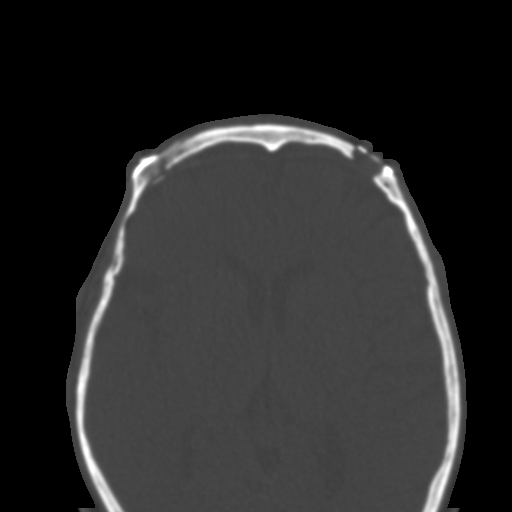

[Series 7: coronal soft tissue · coronal · 0.16mm/px · 3 of 76 slices shown]
[im 26/76  bone]
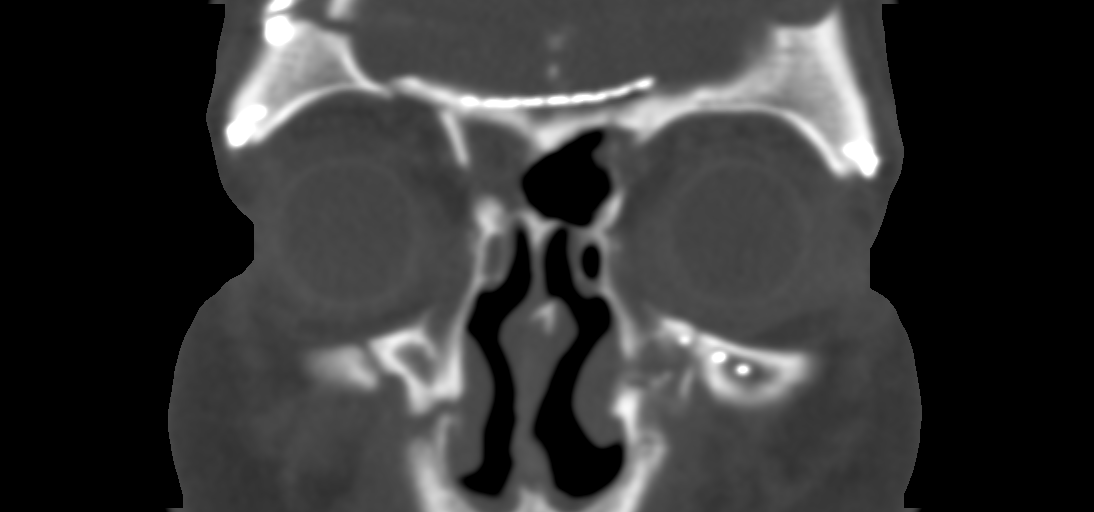
[im 34/76  bone]
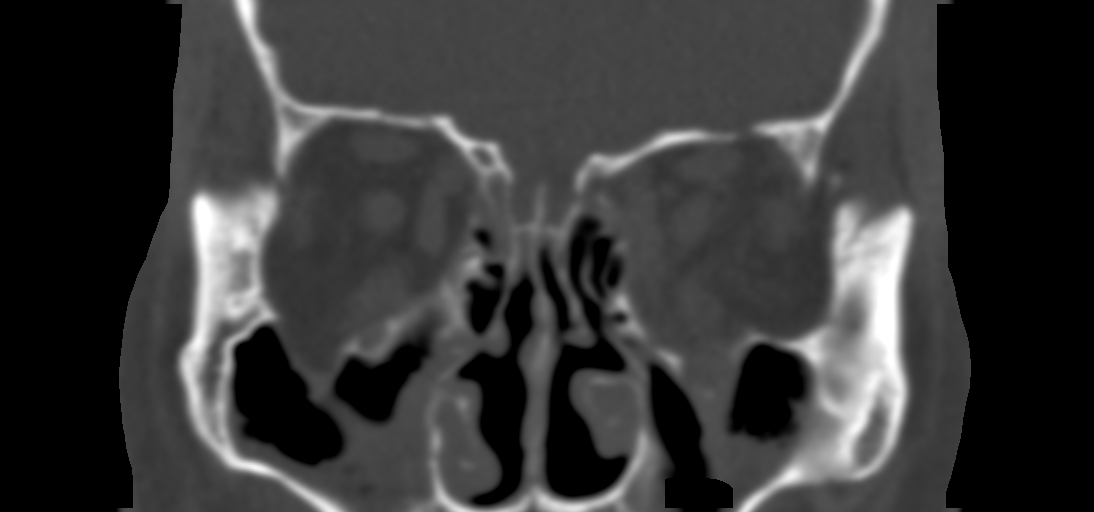
[im 42/76  bone]
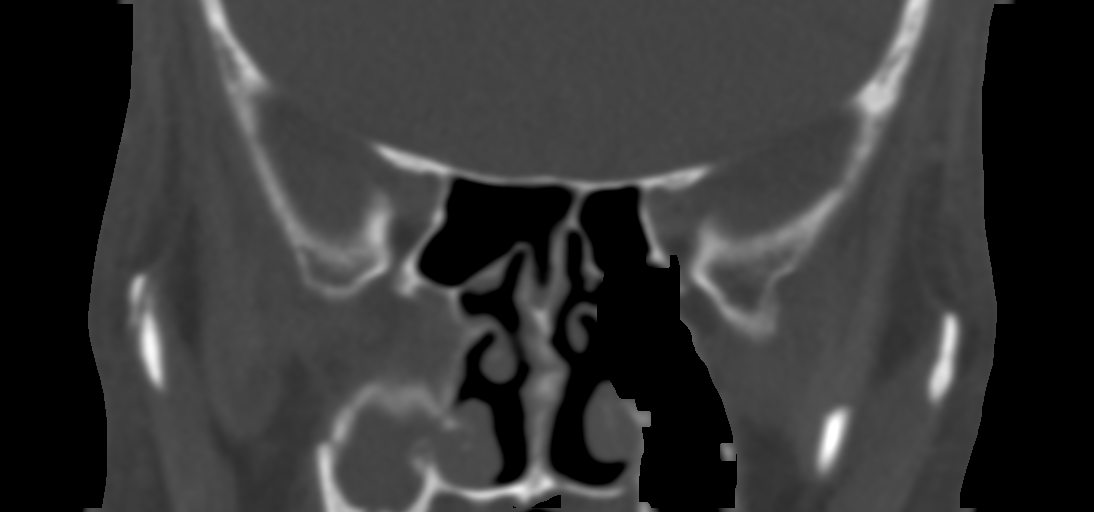

[Series 8: sagittal soft tissue · sagittal · 0.15mm/px · 3 of 76 slices shown]
[im 26/76  bone]
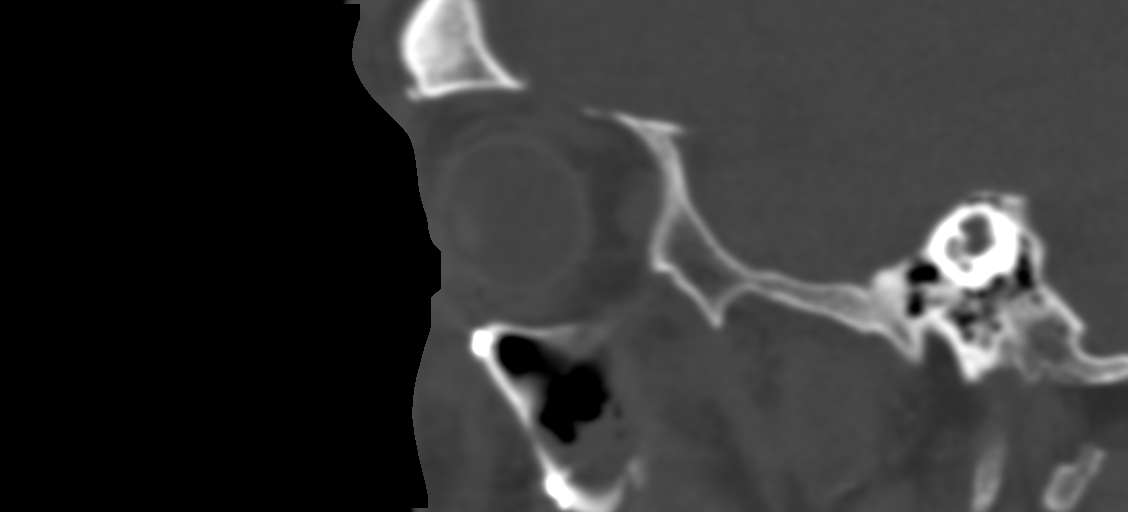
[im 38/76  bone]
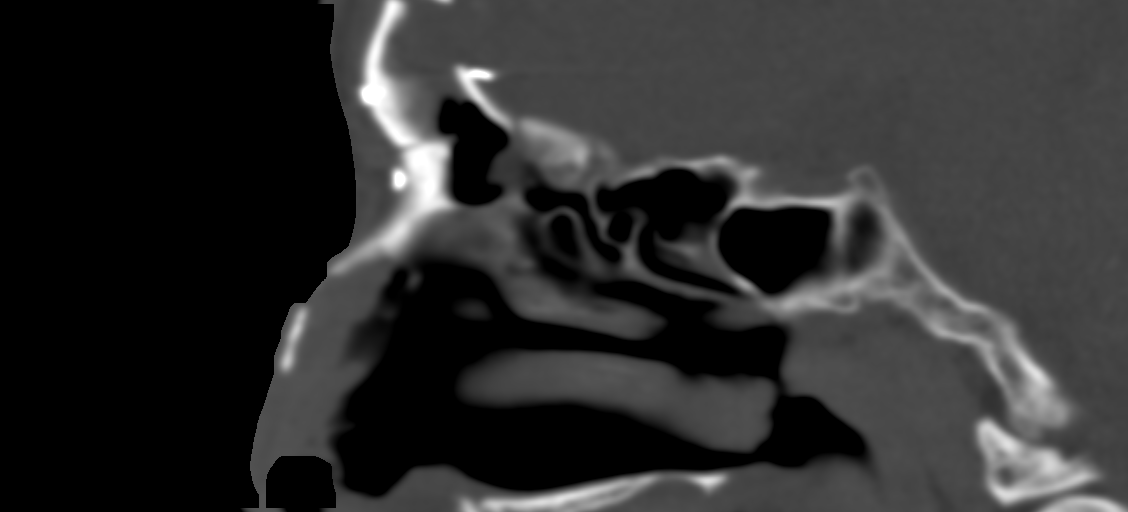
[im 51/76  bone]
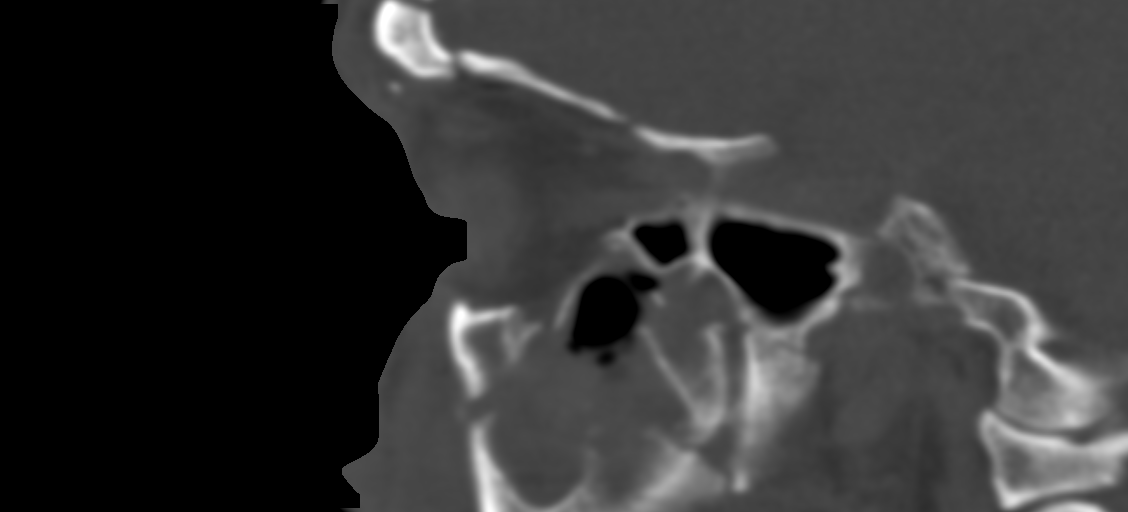

[10 of 47 positions shown; findings below may reference images not displayed]

FINDINGS: Orbits: Extensive orbital fractures bilaterally as described below.
There is soft tissue swelling of the left lower eyelid which is
asymmetric. No orbital mass or edema. Globe is normal bilaterally.
Lens is normally position. Extraocular muscles and optic nerve
normal in caliber bilaterally.

Skeletal: Extensive facial fractures. Unfortunately, no prior
studies available for comparison.

Comminuted fractures of the orbital floor bilaterally. Fracture of
the orbital roof and lateral orbit bilaterally. Fracture of the
medial orbit bilaterally with significant displacement.

Displaced fractures of the maxillary sinus bilaterally plate
fixation of frontozygomatic fracture bilaterally. Fractures of the
zygomatic arch bilaterally. Fracture through the frontal sinus.
Prior craniotomy and repair of the internal table fracture with a
metal plate. There is dural thickening in the frontal lobes
bilaterally anteriorly. Comminuted displaced fracture of the nasal
bone. Large bone fragment in the soft tissues anterior and inferior
to the nasal bone. Extensive and displaced fractures of the
maxillary sinus bilaterally

Visualized sinuses: Mucosal edema in the maxillary sinus
bilaterally. Near complete opacification of the frontal sinus.

Soft tissues: Soft tissue swelling left lower eyelid.

Limited intracranial: Bifrontal craniotomy. Dural thickening
anteriorly with mild extra-axial fluid collection. No hemorrhage.
Ventricle size normal.
IMPRESSION: 1. Soft tissue swelling left lower eyelid of uncertain significance.
Differential includes cellulitis, scarring, and contusion. Correlate
with timing of prior injury.
2. Extensive facial fractures as described above.

## 2021-03-11 IMAGING — DX DG ABDOMEN 1V
1 series · 1 of 1 positions shown · non-contrast
Comparison: 12/16/2019

CLINICAL DATA: PEG placement.

EXAM:
ABDOMEN - 1 VIEW

[abdomen kub]
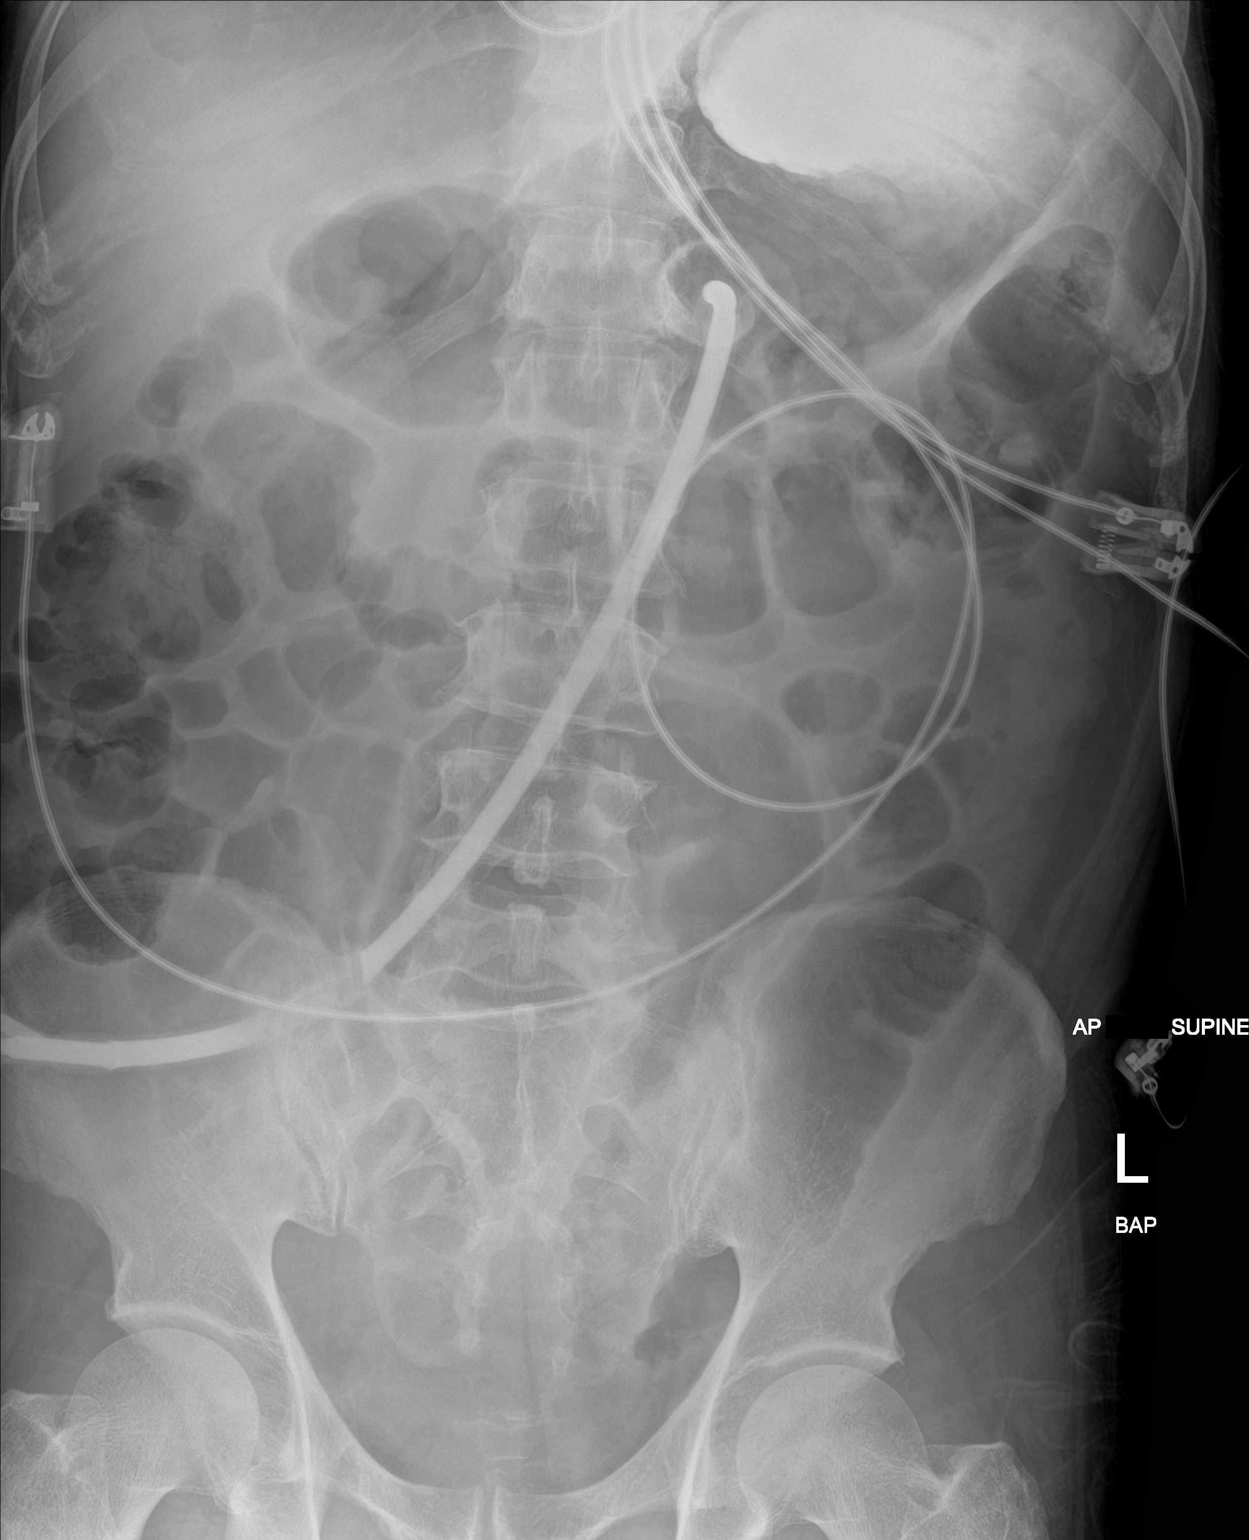

[1 of 1 positions shown; findings below may reference images not displayed]

FINDINGS: Gastrostomy tube tip in the mid stomach with injected contrast in
the tubing and in the gastric fundus. No extravasated contrast seen.
Normal bowel gas pattern. Mild lumbar and lower thoracic spine
degenerative changes.
IMPRESSION: Gastrostomy tube tip in the mid stomach.
